# Patient Record
Sex: Male | Born: 1935 | Race: Black or African American | Hispanic: No | Smoking: Former smoker
Health system: Southern US, Community
[De-identification: ages and names within clinical notes are randomized; demographics above are authoritative.]

## PROBLEM LIST (undated history)

## (undated) DIAGNOSIS — I10 Essential (primary) hypertension: Secondary | ICD-10-CM

## (undated) HISTORY — PX: TOTAL KNEE ARTHROPLASTY: SHX125

---

## 2003-09-22 ENCOUNTER — Other Ambulatory Visit: Payer: Self-pay

## 2004-04-23 ENCOUNTER — Emergency Department: Payer: Self-pay | Admitting: Emergency Medicine

## 2004-07-14 ENCOUNTER — Emergency Department: Payer: Self-pay | Admitting: Emergency Medicine

## 2004-07-16 ENCOUNTER — Inpatient Hospital Stay: Payer: Self-pay

## 2004-08-01 ENCOUNTER — Ambulatory Visit: Payer: Self-pay | Admitting: Urology

## 2005-03-12 ENCOUNTER — Other Ambulatory Visit: Payer: Self-pay

## 2005-03-13 ENCOUNTER — Inpatient Hospital Stay: Payer: Self-pay

## 2005-07-03 ENCOUNTER — Emergency Department: Payer: Self-pay | Admitting: Emergency Medicine

## 2005-08-27 ENCOUNTER — Emergency Department: Payer: Self-pay | Admitting: Internal Medicine

## 2005-09-30 ENCOUNTER — Ambulatory Visit: Payer: Self-pay | Admitting: Infectious Diseases

## 2006-02-04 ENCOUNTER — Emergency Department: Payer: Self-pay | Admitting: Emergency Medicine

## 2006-10-17 ENCOUNTER — Emergency Department: Payer: Self-pay | Admitting: Emergency Medicine

## 2007-07-30 ENCOUNTER — Emergency Department: Payer: Self-pay | Admitting: Emergency Medicine

## 2008-02-10 ENCOUNTER — Emergency Department: Payer: Self-pay | Admitting: Unknown Physician Specialty

## 2008-04-16 ENCOUNTER — Other Ambulatory Visit: Payer: Self-pay

## 2008-04-16 ENCOUNTER — Emergency Department: Payer: Self-pay | Admitting: Unknown Physician Specialty

## 2008-07-18 ENCOUNTER — Emergency Department: Payer: Self-pay | Admitting: Emergency Medicine

## 2008-08-18 ENCOUNTER — Emergency Department: Payer: Self-pay | Admitting: Emergency Medicine

## 2008-09-17 ENCOUNTER — Emergency Department: Payer: Self-pay

## 2008-09-28 ENCOUNTER — Emergency Department: Payer: Self-pay | Admitting: Emergency Medicine

## 2008-10-24 ENCOUNTER — Emergency Department: Payer: Self-pay | Admitting: Emergency Medicine

## 2008-11-23 ENCOUNTER — Emergency Department: Payer: Self-pay | Admitting: Unknown Physician Specialty

## 2009-03-14 ENCOUNTER — Emergency Department: Payer: Self-pay | Admitting: Unknown Physician Specialty

## 2009-03-20 ENCOUNTER — Emergency Department: Payer: Self-pay | Admitting: Internal Medicine

## 2009-03-31 ENCOUNTER — Observation Stay: Payer: Self-pay | Admitting: Internal Medicine

## 2009-05-10 ENCOUNTER — Emergency Department: Payer: Self-pay | Admitting: Emergency Medicine

## 2009-09-26 ENCOUNTER — Emergency Department: Payer: Self-pay | Admitting: Emergency Medicine

## 2009-10-27 ENCOUNTER — Emergency Department: Payer: Self-pay | Admitting: Emergency Medicine

## 2009-12-21 ENCOUNTER — Emergency Department: Payer: Self-pay | Admitting: Emergency Medicine

## 2010-04-18 ENCOUNTER — Emergency Department: Payer: Self-pay | Admitting: Emergency Medicine

## 2010-09-23 IMAGING — CR DG TIBIA/FIBULA 2V*L*
1 series · 2 of 2 positions shown · non-contrast
Comparison: none

REASON FOR EXAM: Pain, Left lower leg, ankle - non-traumatic - very
remote history of Rod in Tib
COMMENTS:

[Series 1: view not recorded · 0.17mm/px · 2 of 2 slices shown]
[im 1/2]
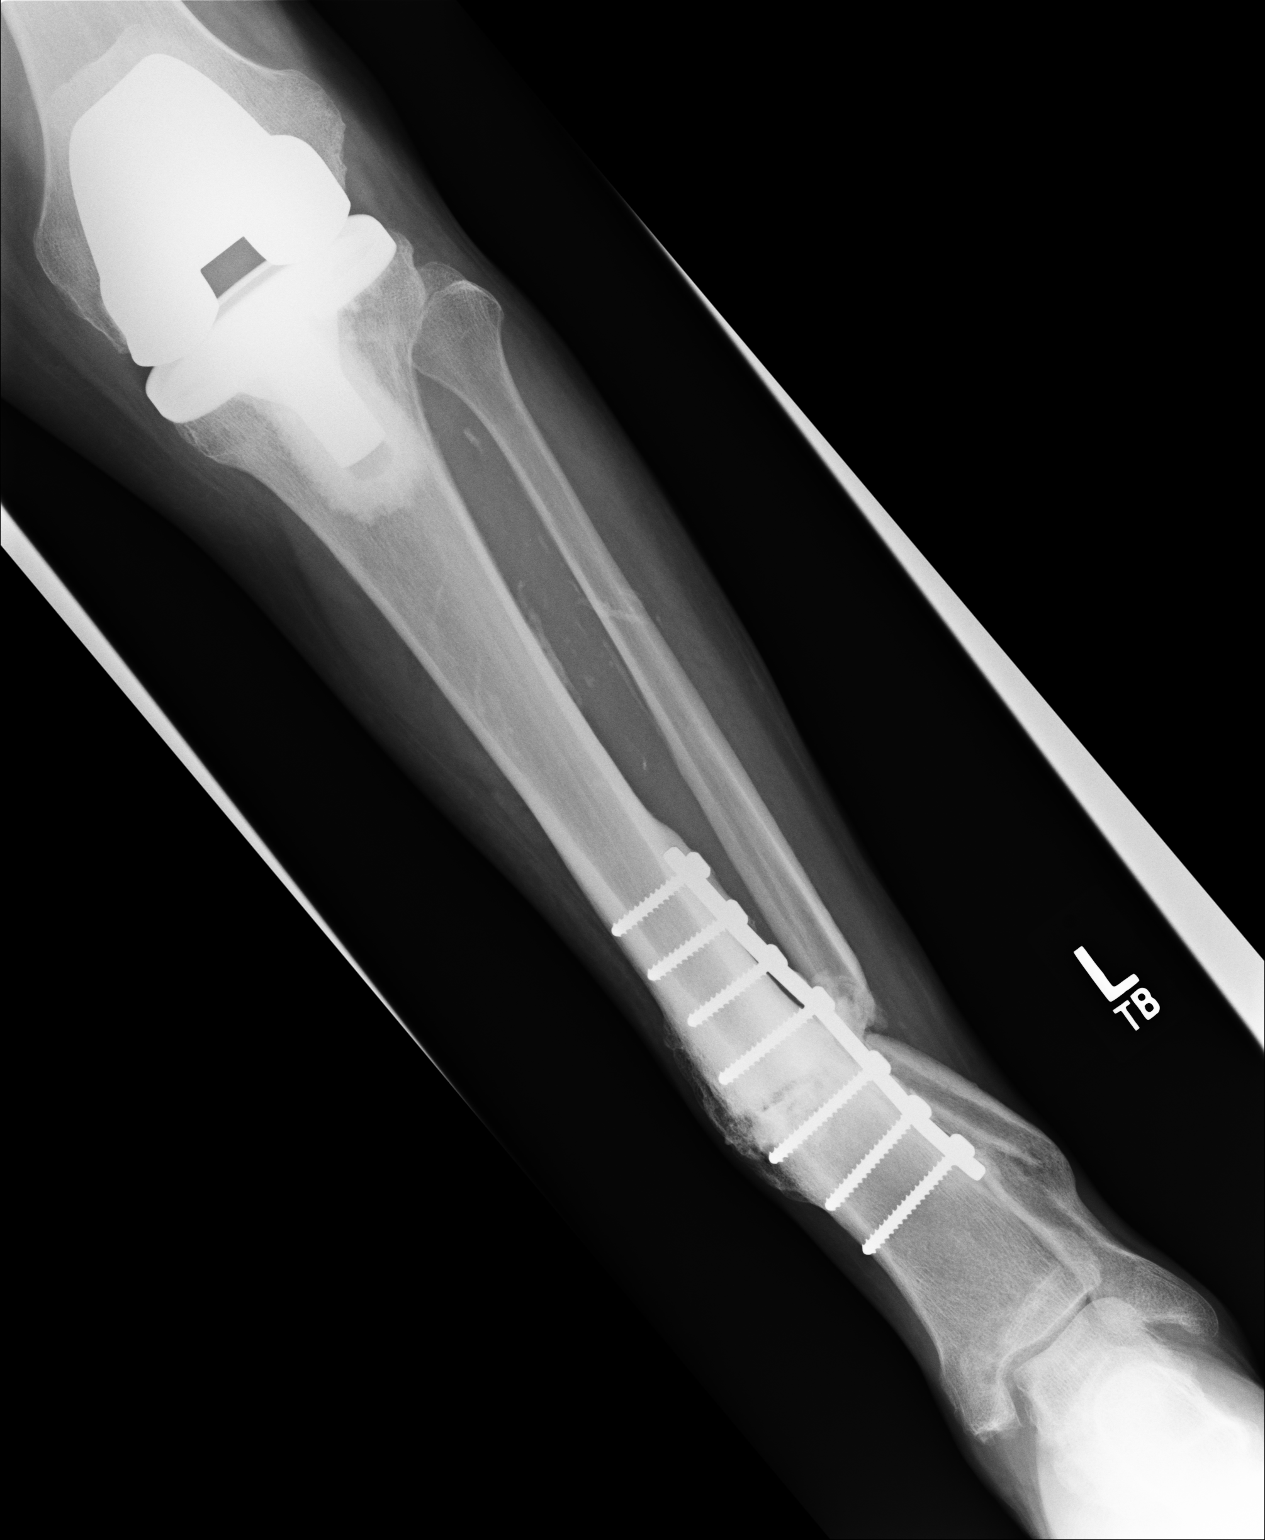
[im 2/2]
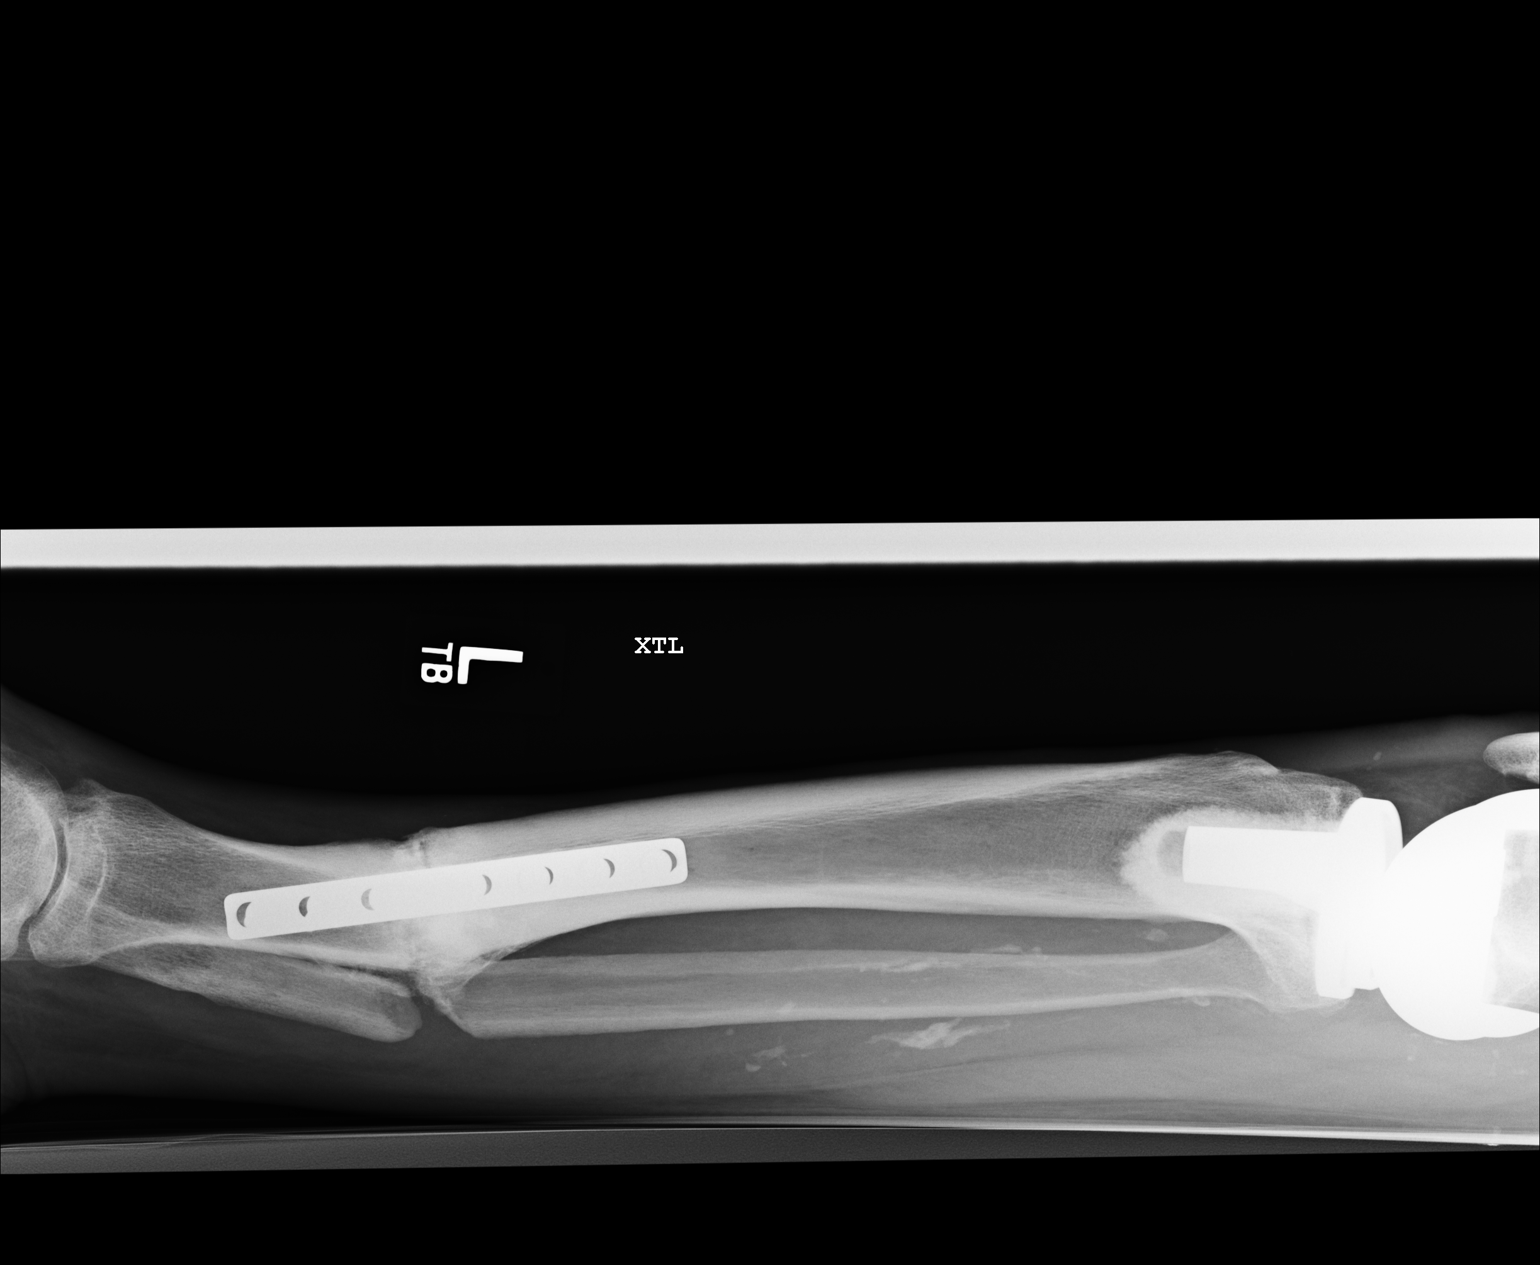

[2 of 2 positions shown; findings below may reference images not displayed]

PROCEDURE:     DXR - DXR TIBIA AND FIBULA LT (LOWER L  - December 21, 2009  [DATE]

RESULT:     AP and lateral views of the left tibia and fibula are submitted.
The patient has undergone prior total knee joint replacement. The patient
also undergone prior ORIF for a fracture involving the shaft of the distal
the tibia. A fracture line remains visible. A orthopedic sideplate and
compression screw are present. There is an old ununited fracture of the
junction of the middle and distal thirds of the left tibia.
IMPRESSION: 1. The patient has undergone prior ORIF for distal tibial shaft fracture.
The fracture line remains visible. There is a small amount of periosteal
reaction. The orthopedic side plate and compression screws appear intact.
2. There is an old nonunion of an adjacent fibular shaft fracture. There is
angulation at this site. There is old deformity more distally involving the
shaft of the fibula that also appears old.
3. The patient has undergone prior left total knee joint replacement.

## 2010-11-17 ENCOUNTER — Emergency Department: Payer: Self-pay | Admitting: Emergency Medicine

## 2011-02-28 ENCOUNTER — Emergency Department: Payer: Self-pay | Admitting: Emergency Medicine

## 2011-06-02 ENCOUNTER — Emergency Department: Payer: Self-pay | Admitting: Emergency Medicine

## 2011-09-17 ENCOUNTER — Ambulatory Visit: Payer: Self-pay | Admitting: Ophthalmology

## 2011-10-01 ENCOUNTER — Emergency Department: Payer: Self-pay | Admitting: Emergency Medicine

## 2012-03-07 ENCOUNTER — Observation Stay: Payer: Self-pay | Admitting: Internal Medicine

## 2012-03-07 LAB — COMPREHENSIVE METABOLIC PANEL
Albumin: 3.3 g/dL — ABNORMAL LOW (ref 3.4–5.0)
Alkaline Phosphatase: 74 U/L (ref 50–136)
BUN: 12 mg/dL (ref 7–18)
Chloride: 109 mmol/L — ABNORMAL HIGH (ref 98–107)
Co2: 25 mmol/L (ref 21–32)
Creatinine: 1.2 mg/dL (ref 0.60–1.30)
EGFR (Non-African Amer.): 54 — ABNORMAL LOW
Osmolality: 279 (ref 275–301)
Potassium: 4 mmol/L (ref 3.5–5.1)
SGOT(AST): 9 U/L — ABNORMAL LOW (ref 15–37)
Sodium: 140 mmol/L (ref 136–145)

## 2012-03-07 LAB — PRO B NATRIURETIC PEPTIDE: B-Type Natriuretic Peptide: 391 pg/mL (ref 0–450)

## 2012-03-07 LAB — CBC
MCH: 29.3 pg (ref 26.0–34.0)
MCHC: 33.6 g/dL (ref 32.0–36.0)
MCV: 87 fL (ref 80–100)
Platelet: 172 10*3/uL (ref 150–440)
RBC: 4.46 10*6/uL (ref 4.40–5.90)
RDW: 15.8 % — ABNORMAL HIGH (ref 11.5–14.5)

## 2012-03-07 LAB — TROPONIN I: Troponin-I: <0.02 ng/mL

## 2012-03-07 LAB — CK TOTAL AND CKMB (NOT AT ARMC): CK-MB: 0.9 ng/mL (ref 0.5–3.6)

## 2012-03-08 LAB — LIPID PANEL
Cholesterol: 177 mg/dL (ref 0–200)
HDL Cholesterol: 71 mg/dL — ABNORMAL HIGH (ref 40–60)
Ldl Cholesterol, Calc: 90 mg/dL (ref 0–100)
VLDL Cholesterol, Calc: 16 mg/dL (ref 5–40)

## 2012-03-08 LAB — TROPONIN I: Troponin-I: <0.02 ng/mL

## 2012-03-08 LAB — TSH: Thyroid Stimulating Horm: 1.55 u[IU]/mL

## 2012-03-23 ENCOUNTER — Ambulatory Visit: Payer: Self-pay | Admitting: Urology

## 2012-03-25 ENCOUNTER — Ambulatory Visit: Payer: Self-pay | Admitting: Internal Medicine

## 2012-06-04 ENCOUNTER — Emergency Department: Payer: Self-pay | Admitting: Emergency Medicine

## 2012-06-05 LAB — TROPONIN I
Troponin-I: <0.02 ng/mL
Troponin-I: <0.02 ng/mL

## 2012-06-05 LAB — COMPREHENSIVE METABOLIC PANEL
Albumin: 3.3 g/dL — ABNORMAL LOW (ref 3.4–5.0)
Alkaline Phosphatase: 89 U/L (ref 50–136)
Bilirubin,Total: 0.3 mg/dL (ref 0.2–1.0)
Calcium, Total: 8.4 mg/dL — ABNORMAL LOW (ref 8.5–10.1)
Chloride: 107 mmol/L (ref 98–107)
Co2: 25 mmol/L (ref 21–32)
EGFR (Non-African Amer.): 58 — ABNORMAL LOW
Glucose: 107 mg/dL — ABNORMAL HIGH (ref 65–99)
Osmolality: 277 (ref 275–301)
Potassium: 4.5 mmol/L (ref 3.5–5.1)
SGPT (ALT): 16 U/L (ref 12–78)
Sodium: 138 mmol/L (ref 136–145)
Total Protein: 7.2 g/dL (ref 6.4–8.2)

## 2012-06-05 LAB — CBC
MCH: 28.9 pg (ref 26.0–34.0)
MCHC: 32.7 g/dL (ref 32.0–36.0)
MCV: 88 fL (ref 80–100)
Platelet: 180 10*3/uL (ref 150–440)
WBC: 4.3 10*3/uL (ref 3.8–10.6)

## 2012-06-05 LAB — CK TOTAL AND CKMB (NOT AT ARMC): CK, Total: 123 U/L (ref 35–232)

## 2013-07-05 ENCOUNTER — Emergency Department: Payer: Self-pay | Admitting: Emergency Medicine

## 2013-11-20 ENCOUNTER — Emergency Department: Payer: Self-pay | Admitting: Emergency Medicine

## 2014-01-18 ENCOUNTER — Ambulatory Visit: Payer: Self-pay | Admitting: Ophthalmology

## 2014-04-07 IMAGING — CR DG CHEST 2V
1 series · 2 of 2 positions shown · non-contrast
Comparison: 06/05/2012 and 03/07/2012

CLINICAL DATA: Cough and congestion.

EXAM:
CHEST  2 VIEW

[Series 1: w chest pa · 0.14mm/px · 2 of 2 slices shown]
[im 1/2]
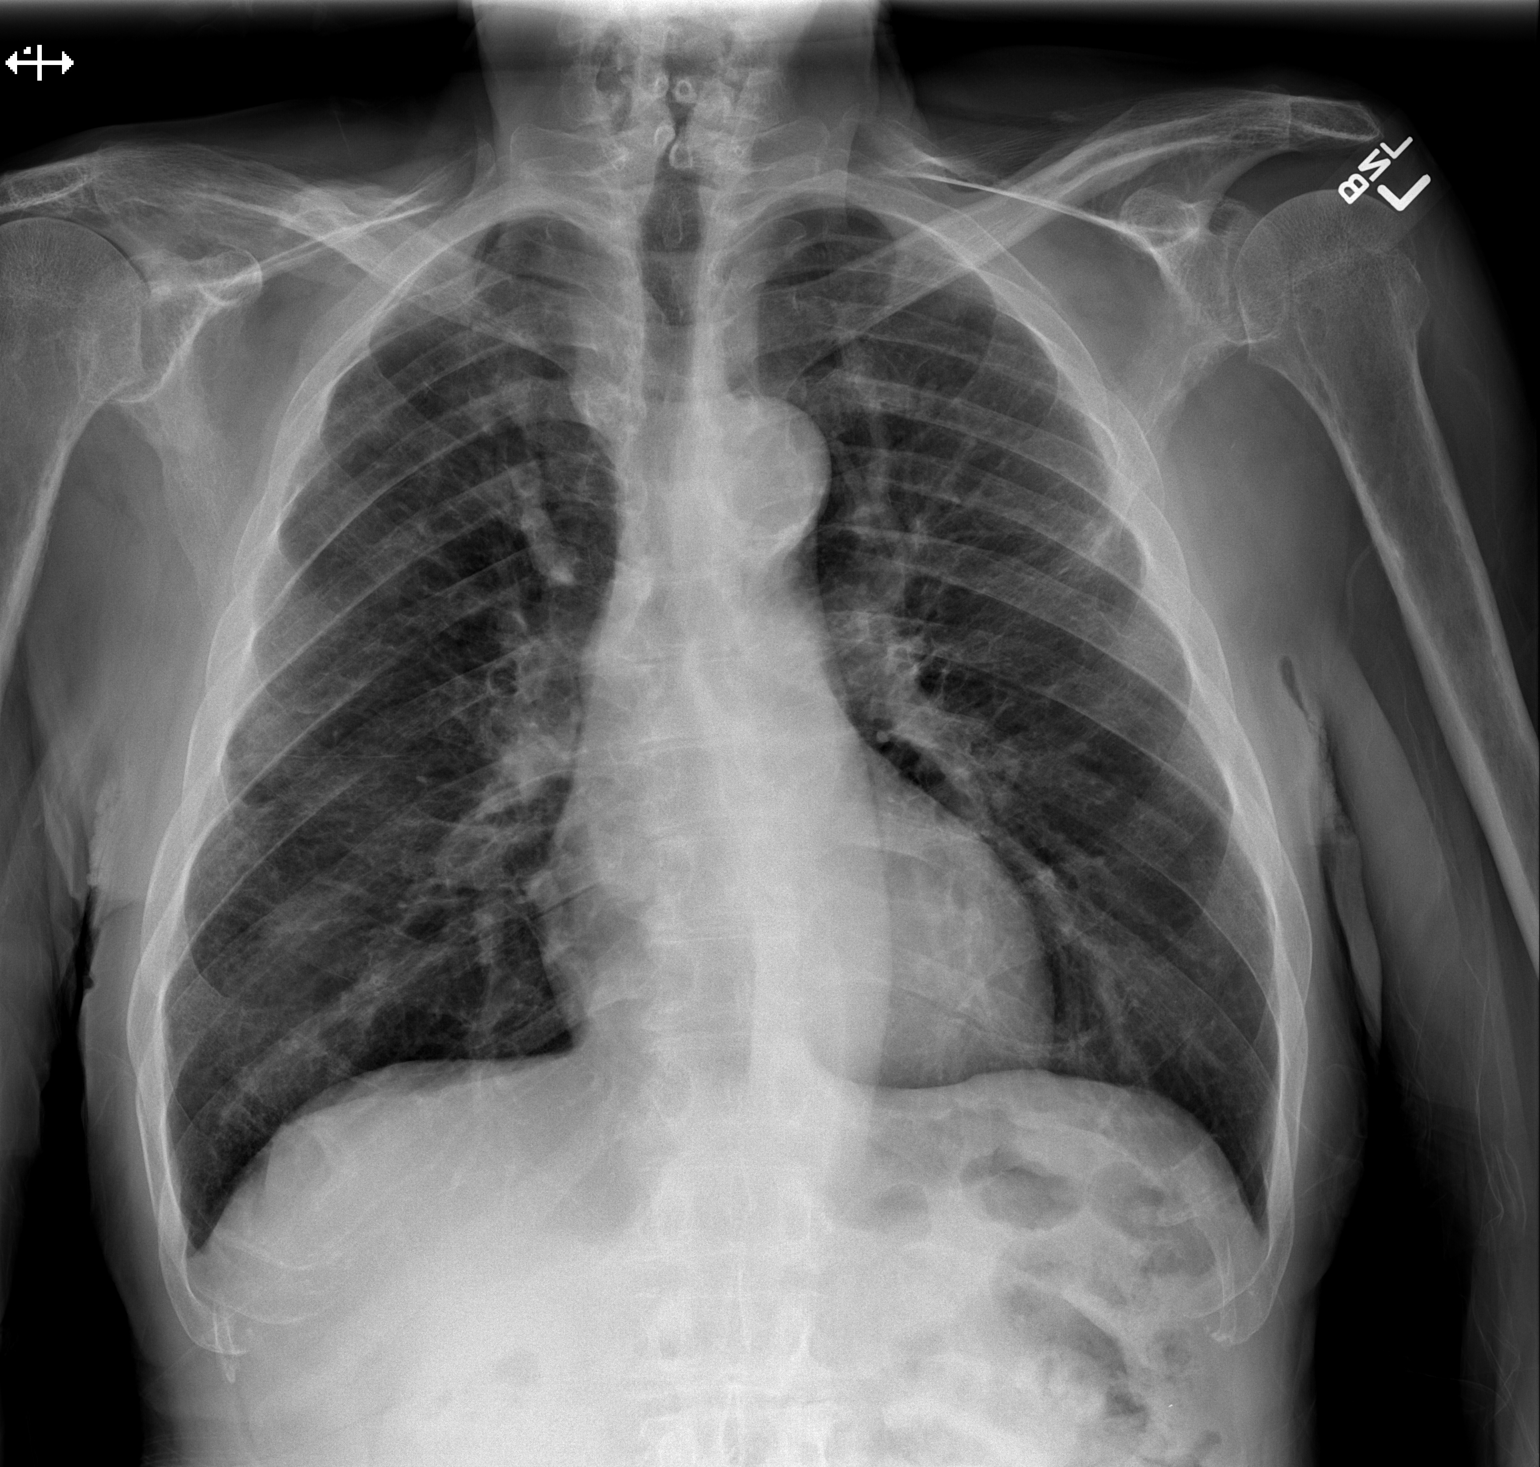
[im 2/2]
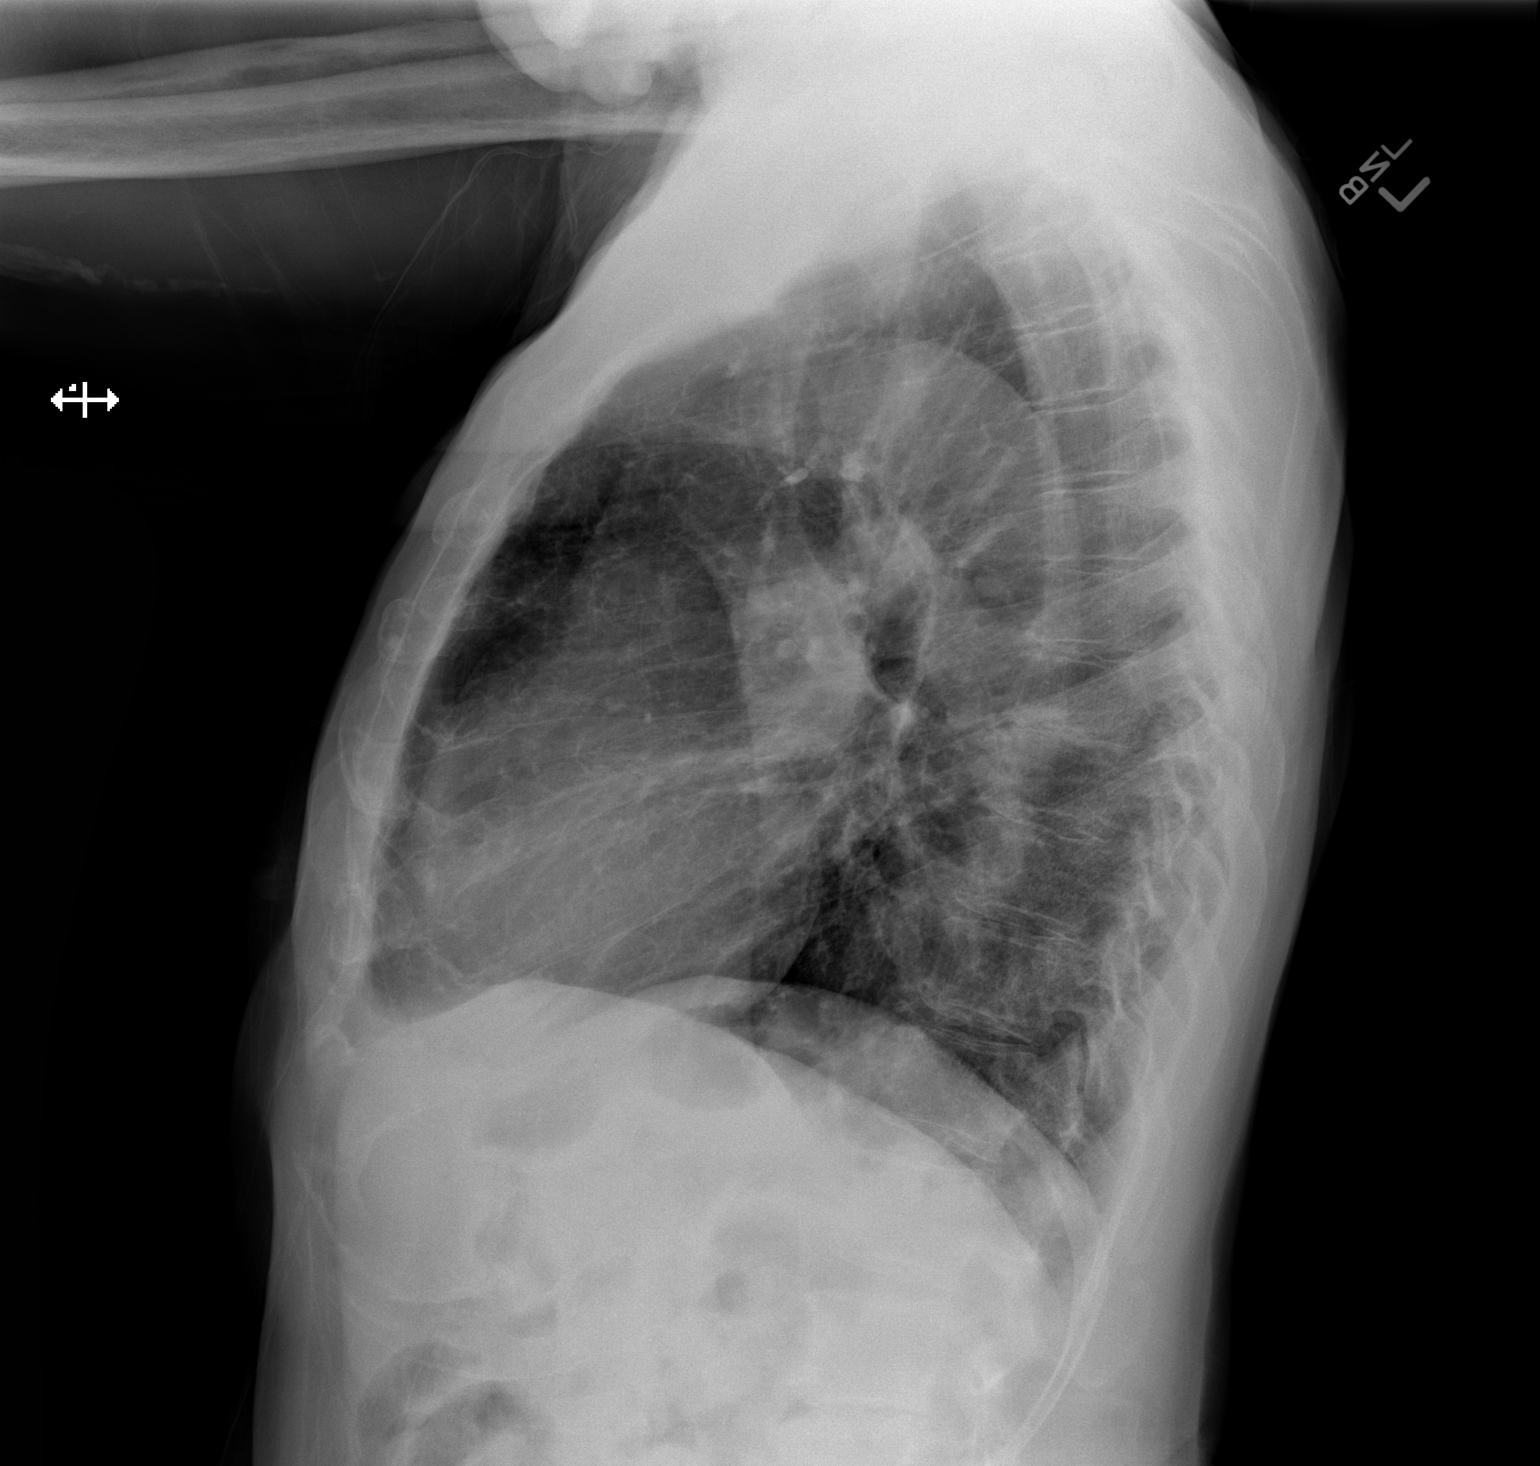

[2 of 2 positions shown; findings below may reference images not displayed]

FINDINGS: The heart size and mediastinal contours are stable. There is mild
aortic atherosclerosis. Chronic lung disease with generalized
interstitial prominence appears unchanged. No airspace disease,
pleural effusion or pneumothorax is identified. Old fracture of the
mid right clavicle appears stable.
IMPRESSION: Stable chronic lung disease. No acute cardiopulmonary process
identified.

## 2014-05-28 ENCOUNTER — Emergency Department: Payer: Self-pay | Admitting: Internal Medicine

## 2014-05-28 LAB — COMPREHENSIVE METABOLIC PANEL
ALBUMIN: 2.9 g/dL — AB (ref 3.4–5.0)
ALK PHOS: 68 U/L
ALT: 17 U/L
ANION GAP: 5 — AB (ref 7–16)
BILIRUBIN TOTAL: 0.2 mg/dL (ref 0.2–1.0)
BUN: 13 mg/dL (ref 7–18)
CHLORIDE: 111 mmol/L — AB (ref 98–107)
CO2: 28 mmol/L (ref 21–32)
Calcium, Total: 7.9 mg/dL — ABNORMAL LOW (ref 8.5–10.1)
Creatinine: 1.13 mg/dL (ref 0.60–1.30)
EGFR (African American): >60
EGFR (Non-African Amer.): >60
Glucose: 85 mg/dL (ref 65–99)
Osmolality: 286 (ref 275–301)
POTASSIUM: 4.6 mmol/L (ref 3.5–5.1)
SGOT(AST): 12 U/L — ABNORMAL LOW (ref 15–37)
Sodium: 144 mmol/L (ref 136–145)
Total Protein: 6.4 g/dL (ref 6.4–8.2)

## 2014-05-28 LAB — ED INFLUENZA
H1N1 flu by pcr: NOT DETECTED
INFLAPCR: NEGATIVE
INFLBPCR: NEGATIVE

## 2014-05-28 LAB — CBC
HCT: 36.5 % — AB (ref 40.0–52.0)
HGB: 11.7 g/dL — AB (ref 13.0–18.0)
MCH: 28.6 pg (ref 26.0–34.0)
MCHC: 32 g/dL (ref 32.0–36.0)
MCV: 90 fL (ref 80–100)
PLATELETS: 158 10*3/uL (ref 150–440)
RBC: 4.08 10*6/uL — ABNORMAL LOW (ref 4.40–5.90)
RDW: 14.7 % — ABNORMAL HIGH (ref 11.5–14.5)
WBC: 4.5 10*3/uL (ref 3.8–10.6)

## 2014-07-23 ENCOUNTER — Emergency Department: Payer: Self-pay | Admitting: Emergency Medicine

## 2014-07-23 LAB — TROPONIN I

## 2014-07-23 LAB — COMPREHENSIVE METABOLIC PANEL
ALK PHOS: 88 U/L
ALT: 18 U/L
Albumin: 3.2 g/dL — ABNORMAL LOW (ref 3.4–5.0)
Anion Gap: 6 — ABNORMAL LOW (ref 7–16)
BUN: 14 mg/dL (ref 7–18)
Bilirubin,Total: 0.3 mg/dL (ref 0.2–1.0)
CHLORIDE: 106 mmol/L (ref 98–107)
Calcium, Total: 8.1 mg/dL — ABNORMAL LOW (ref 8.5–10.1)
Co2: 28 mmol/L (ref 21–32)
Creatinine: 1.23 mg/dL (ref 0.60–1.30)
EGFR (African American): >60
EGFR (Non-African Amer.): 59 — ABNORMAL LOW
GLUCOSE: 103 mg/dL — AB (ref 65–99)
Osmolality: 280 (ref 275–301)
Potassium: 4.4 mmol/L (ref 3.5–5.1)
SGOT(AST): 23 U/L (ref 15–37)
Sodium: 140 mmol/L (ref 136–145)
TOTAL PROTEIN: 6.9 g/dL (ref 6.4–8.2)

## 2014-07-23 LAB — CBC
HCT: 37.2 % — ABNORMAL LOW (ref 40.0–52.0)
HGB: 12.1 g/dL — ABNORMAL LOW (ref 13.0–18.0)
MCH: 28.8 pg (ref 26.0–34.0)
MCHC: 32.4 g/dL (ref 32.0–36.0)
MCV: 89 fL (ref 80–100)
PLATELETS: 159 10*3/uL (ref 150–440)
RBC: 4.19 10*6/uL — ABNORMAL LOW (ref 4.40–5.90)
RDW: 14.9 % — AB (ref 11.5–14.5)
WBC: 4 10*3/uL (ref 3.8–10.6)

## 2014-07-23 LAB — LIPASE, BLOOD: Lipase: 51 U/L — ABNORMAL LOW (ref 73–393)

## 2014-07-24 LAB — TROPONIN I

## 2014-07-24 LAB — MAGNESIUM: Magnesium: 2.3 mg/dL

## 2014-10-24 NOTE — Consult Note (Signed)
    General Aspect patient is an 81 year old male with history of chronic left bundle branch block and bradycardia who is admitted with several days of left arm pain.  Patient denied any dizziness, lightheadedness weakness or fatigue.  His left arm had been bothering him and he was unaware of what was causing it.  He was admitted and has ruled out for myocardial infarction.  He was relatively bradycardic but hemodynamically stable.  His beta blocker was held.  His left arm pain has improved.  X-ray of his arm has not shown any significant abnormalities. the patient states that he only gets dizzy when he is laying in bed when he first lays down for the first few minutes.   Physical Exam:   GEN thin    HEENT hearing intact to voice    NECK supple    RESP normal resp effort    CARD Regular rate and rhythm  Bradycardic    ABD denies tenderness  normal BS    LYMPH negative neck    EXTR negative edema    SKIN No rashes    NEURO cranial nerves intact, motor/sensory function intact    PSYCH A+O to time, place, person   Review of Systems:   Subjective/Chief Complaint left arm pain    General: No Complaints    Skin: No Complaints    ENT: No Complaints    Eyes: No Complaints    Neck: No Complaints    Respiratory: No Complaints    Cardiovascular: No Complaints    Gastrointestinal: No Complaints    Genitourinary: No Complaints    Vascular: No Complaints    Musculoskeletal: left arm and shoulder pain    Neurologic: dizziness when laying down    Hematologic: No Complaints    Endocrine: No Complaints    Psychiatric: No Complaints    Review of Systems: All other systems were reviewed and found to be negative    Medications/Allergies Reviewed Medications/Allergies reviewed     GERD - Esophageal Reflux:    Hard of Hearing:    Pneumonia:    Negative, Patient denies surgical history:   EKG:   Abnormal LBBB    Interpretation bradycardia    No Known Allergies:      Impression 81 year old male with history of chronic left bundle branch block who is admitted with left arm pain.  He denies any dizziness or lightheadedness or syncope.  The only dizziness that he notes one occasion as when he is laying in bed and rolls over or first lays down, the symptoms resolved within minutes.  He denies chest pain.  He is ruled out for myocardial infarction.  EKG is unchanged from his previous EKGs.   He has had his beta blocker held and has done well with this.  Patient is at his baseline.  He does not appear to require a permanent pacemaker at present.  Would continue to hold his beta blockers, ambulating consider discharge if he is stable hemodynamically.    Plan 1.  Agree with holding metoprolol 2.  Ambulate and follow for evidence of symptoms 3.  Consider discharge is stable with followup with his primary care provider as well as with Dr. Juliann Paresallwood, his cardiologist.   Electronic Signatures: Dalia HeadingFath, Loriene Taunton A (MD)  (Signed 02-Sep-13 10:58)  Authored: General Aspect/Present Illness, History and Physical Exam, Review of System, Past Medical History, EKG , Allergies, Impression/Plan   Last Updated: 02-Sep-13 10:58 by Dalia HeadingFath, Earlyne Feeser A (MD)
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
## 2014-10-24 NOTE — Discharge Summary (Signed)
PATIENT NAME:  Richard [name], Richard [name] MR#:  [id number] DATE OF BIRTH:  11/13/1920  DATE OF ADMISSION:  03/07/2012 DATE OF DISCHARGE:  03/08/2012  PRESENTING COMPLAINT: Left arm pain.   DISCHARGE DIAGNOSES:  1. Left arm pain improved, appears more musculoskeletal.  2. Dizziness, improved.  3. History of left bundle branch block per EKG which is chronic.  DISCHARGE MEDICATIONS: 1. Aspirin 81 mg daily.  2. Multivitamin daily.   DIET: Regular.   FOLLOWUP: Follow up with Dr. Dorothyann Pengwayne Callwood,  cardiologist, as outpatient in 1 to 2 weeks.   LABORATORY, DIAGNOSTIC, AND RADIOLOGICAL DATA: Lipid profile within normal limits. TSH 1.55, cardiac enzymes times three negative. Left shoulder x-ray negative for any acute abnormality. CBC within normal limits. Comprehensive metabolic panel within normal limits. B-type natriuretic peptide 391.   EKG: Normal sinus rhythm with sinus arrhythmia.    CONSULTATION: Cardiology consultation with Dr. Lady GaryFath.   BRIEF SUMMARY OF HOSPITAL COURSE: [name] is an 81 year old African American gentleman who came into the Emergency Room with:  1. Left arm pain. The patient had been having some arm pain for the last couple of days. It improved after giving him IV pain medications. He was admitted. Cardiac enzymes remained negative. No acute EKG changes were noted. The patient's arm pain appeared to be arthritic/musculoskeletal. His EKG showed bradycardia with left bundle branch block which was the same as compared to 2010. No documented history of coronary artery disease. Echo in 2010 showed an ejection fraction around 45%. The patient was seen by Dr. Lady GaryFath. Since the patient remained asymptomatic and ambulated well, no chest pain, and arm pain improved, he will follow up with Dr. Juliann Paresallwood as outpatient. Aspirin was continued.  2. Gastroesophageal reflux disease. On Prilosec.  3. Impaired hearing.  4. Hospital stay otherwise remained stable.  5. CODE STATUS: The patient remained [name]  CODE.   TIME SPENT: 40 minutes.   ____________________________ Wylie HailSona [name]. Allena KatzPatel, MD sap:bjt D: 03/09/2012 15:16:42 ET T: 03/10/2012 13:39:07 ET JOB#: [id number]  cc: Jodye Scali [name]. Allena KatzPatel, MD, <Dictator> Dwayne D. Juliann Paresallwood, MD Willow OraSONA [name] Niharika Savino MD ELECTRONICALLY SIGNED 03/22/2012 22:13

## 2014-12-08 ENCOUNTER — Emergency Department
Admission: EM | Admit: 2014-12-08 | Discharge: 2014-12-08 | Disposition: A | Discharge status: Home / Self Care | Payer: Medicare Other | Attending: Emergency Medicine | Admitting: Emergency Medicine

## 2014-12-08 ENCOUNTER — Encounter: Payer: Self-pay | Admitting: *Deleted

## 2014-12-08 DIAGNOSIS — Y9289 Other specified places as the place of occurrence of the external cause: Secondary | ICD-10-CM | POA: Insufficient documentation

## 2014-12-08 DIAGNOSIS — Z792 Long term (current) use of antibiotics: Secondary | ICD-10-CM | POA: Insufficient documentation

## 2014-12-08 DIAGNOSIS — I10 Essential (primary) hypertension: Secondary | ICD-10-CM | POA: Diagnosis not present

## 2014-12-08 DIAGNOSIS — S30861A Insect bite (nonvenomous) of abdominal wall, initial encounter: Secondary | ICD-10-CM | POA: Diagnosis not present

## 2014-12-08 DIAGNOSIS — R21 Rash and other nonspecific skin eruption: Secondary | ICD-10-CM | POA: Diagnosis present

## 2014-12-08 DIAGNOSIS — Y998 Other external cause status: Secondary | ICD-10-CM | POA: Insufficient documentation

## 2014-12-08 DIAGNOSIS — W57XXXA Bitten or stung by nonvenomous insect and other nonvenomous arthropods, initial encounter: Secondary | ICD-10-CM | POA: Diagnosis not present

## 2014-12-08 DIAGNOSIS — S40261A Insect bite (nonvenomous) of right shoulder, initial encounter: Secondary | ICD-10-CM | POA: Insufficient documentation

## 2014-12-08 DIAGNOSIS — Y9389 Activity, other specified: Secondary | ICD-10-CM | POA: Insufficient documentation

## 2014-12-08 DIAGNOSIS — Z87891 Personal history of nicotine dependence: Secondary | ICD-10-CM | POA: Diagnosis not present

## 2014-12-08 HISTORY — DX: Essential (primary) hypertension: I10

## 2014-12-08 MED ORDER — DOXYCYCLINE HYCLATE 100 MG PO TABS: 100.0000 mg | ORAL_TABLET | Freq: Two times a day (BID) | ORAL | Status: DC

## 2014-12-08 NOTE — ED Provider Notes (Signed)
[facility] Emergency Department Provider Note ?____________________________________________ ? Time seen: 1125 ? I have reviewed the triage vital signs and the nursing notes. ________ HISTORY ? Chief Complaint Rash  HPI  Richard [name] is a 81 y.o. male , who reports to the ED for evaluation of several tick bites,and removal of one tick.he reports a tick likely to his right shoulder, and reports that he removed to other ticks after working in the garden this weekend. He denies fever, chills, sweats, joint pain, or weakness. He notes that the 2 tick bites areas are itchy.  Past Medical History  Diagnosis Date  . Hypertension    There are no active problems to display for this patient. ? History reviewed. No pertinent past surgical history. ? Current Outpatient Rx  Name  Route  Sig  Dispense  Refill  . doxycycline (VIBRA-TABS) 100 MG tablet   Oral   Take 1 tablet (100 mg total) by mouth 2 (two) times daily.   14 tablet   0   ? Allergies Review of patient's allergies indicates no known allergies. ? No family history on file. ? Social History History  Substance Use Topics  . Smoking status: Former Games developer  . Smokeless tobacco: Not on file  . Alcohol Use: Yes   Review of Systems Constitutional: Negative for fever. HEENT:  Normocephalic/atraumatic. Negative for visual/hearingchanges, sore throat, or nasal congestion. Cardiovascular: Negative for chest pain. Respiratory: Negative for shortness of breath. Musculoskeletal: Negative for back pain. Skin: 2 distinct tick bites. Tick confirmed, removed by RN in triage. Neurological: Negative for headaches, focal weakness or numbness. Hematological/Lymphatic:Negative for enlarged lymph nodes  10-point ROS otherwise negative. ____________________________________________  PHYSICAL EXAM:  VITAL SIGNS: ED Triage Vitals  Enc Vitals Group     BP 12/08/14 1108 139/74 mmHg     Pulse Rate 12/08/14 1108 62    Resp 12/08/14 1108 24     Temp 12/08/14 1108 97.6 F (36.4 C)     Temp Source 12/08/14 1108 Oral     SpO2 12/08/14 1108 100 %     Weight 12/08/14 1108 135 lb (61.236 kg)     Height 12/08/14 1108  (1.676 m)     Head Cir --      Peak Flow --      Pain Score --      Pain Loc --      Pain Edu? --      Excl. in GC? --    Constitutional: Alert and oriented. Well appearing and in no distress. HEENT: Normocephalic and atraumatic.Conjunctivae are normal. PERRL. Normal extraocular movements. Mucous membranes are moist. Hematological/Lymphatic/Immunilogical: No cervical lymphadenopathy. Cardiovascular: Normal rate, regular rhythm.No murmurs, rubs, or gallops.  Respiratory: Normal respiratory effort. Musculoskeletal: Nontender with normal range of motion in all extremities. No joint effusions.  No lower extremity tenderness nor edema. Neurologic:  Normal speech and language. No gross focal neurologic deficits are appreciated.  Skin:  Distinct papule with local swelling and erythema and central bite site to the right shoulder blade and right flank. Skin general survey did not reveal any other retained ticks.  Psychiatric: Mood and affect are normal. Speech and behavior are normal. Patient exhibits appropriate insight and judgment. ______________________________________________________ INITIAL IMPRESSION / ASSESSMENT AND PLAN / ED COURSE ? Empiric treatment for multiple tick bites with local cellulitis, with Doxycycline for 1 week.   ____________________________________________ FINAL CLINICAL IMPRESSION(S) / ED DIAGNOSES?  Final diagnoses:  Tick bite      Charlesetta Ivory  Lanise Mergen, PA-C 12/08/14 1416  Governor Rooksebecca Lord, MD 12/08/14 1416

## 2014-12-08 NOTE — ED Notes (Signed)
Pt c/o itching red spot on right hip & right arm (denies itching on this one - just red and raised) as well as lower back; states he has had these spots for 2-3 days; denies pain. Also a small mole or tick on right upper upper shoulder blade.

## 2014-12-08 NOTE — ED Notes (Signed)
Pt discharged home after verbalizing understanding of discharge instructions; nad noted. 

## 2014-12-08 NOTE — Discharge Instructions (Signed)
Tick Bite Information Ticks are insects that attach themselves to the skin and draw blood for food. There are various types of ticks. Common types include wood ticks and deer ticks. Most ticks live in shrubs and grassy areas. Ticks can climb onto your body when you make contact with leaves or grass where the tick is waiting. The most common places on the body for ticks to attach themselves are the scalp, neck, armpits, waist, and groin. Most tick bites are harmless, but sometimes ticks carry germs that cause diseases. These germs can be spread to a person during the tick's feeding process. The chance of a disease spreading through a tick bite depends on:   The type of tick.  Time of year.   How long the tick is attached.   Geographic location.  HOW CAN YOU PREVENT TICK BITES? Take these steps to help prevent tick bites when you are outdoors:  Wear protective clothing. Long sleeves and long pants are best.   Wear white clothes so you can see ticks more easily.  Tuck your pant legs into your socks.   If walking on a trail, stay in the middle of the trail to avoid brushing against bushes.  Avoid walking through areas with long grass.  Put insect repellent on all exposed skin and along boot tops, pant legs, and sleeve cuffs.   Check clothing, hair, and skin repeatedly and before going inside.   Brush off any ticks that are not attached.  Take a shower or bath as soon as possible after being outdoors.  WHAT IS THE PROPER WAY TO REMOVE A TICK? Ticks should be removed as soon as possible to help prevent diseases caused by tick bites. 1. If latex gloves are available, put them on before trying to remove a tick.  2. Using fine-point tweezers, grasp the tick as close to the skin as possible. You may also use curved forceps or a tick removal tool. Grasp the tick as close to its head as possible. Avoid grasping the tick on its body. 3. Pull gently with steady upward pressure until  the tick lets go. Do not twist the tick or jerk it suddenly. This may break off the tick's head or mouth parts. 4. Do not squeeze or crush the tick's body. This could force disease-carrying fluids from the tick into your body.  5. After the tick is removed, wash the bite area and your hands with soap and water or other disinfectant such as alcohol. 6. Apply a small amount of antiseptic cream or ointment to the bite site.  7. Wash and disinfect any instruments that were used.  Do not try to remove a tick by applying a hot match, petroleum jelly, or fingernail polish to the tick. These methods do not work and may increase the chances of disease being spread from the tick bite.  WHEN SHOULD YOU SEEK MEDICAL CARE? Contact your health care provider if you are unable to remove a tick from your skin or if a part of the tick breaks off and is stuck in the skin.  After a tick bite, you need to be aware of signs and symptoms that could be related to diseases spread by ticks. Contact your health care provider if you develop any of the following in the days or weeks after the tick bite:  Unexplained fever.  Rash. A circular rash that appears days or weeks after the tick bite may indicate the possibility of Lyme disease. The rash may resemble  a target with a bull's-eye and may occur at a different part of your body than the tick bite.  Redness and swelling in the area of the tick bite.   Tender, swollen lymph glands.   Diarrhea.   Weight loss.   Cough.   Fatigue.   Muscle, joint, or bone pain.   Abdominal pain.   Headache.   Lethargy or a change in your level of consciousness.  Difficulty walking or moving your legs.   Numbness in the legs.   Paralysis.  Shortness of breath.   Confusion.   Repeated vomiting.  Document Released: 06/20/2000 Document Revised: 04/13/2013 Document Reviewed: 12/01/2012 [facility] Patient Information 2015 [place]. This information is  not intended to replace advice given to you by your health care provider. Make sure you discuss any questions you have with your health care provider.   Take the prescription meds as directed.  Apply OTC cortisone cream as needed for itch relief.  [facility] your provider or [facility] needed.

## 2015-01-20 ENCOUNTER — Emergency Department
Admission: EM | Admit: 2015-01-20 | Discharge: 2015-01-20 | Disposition: A | Discharge status: Home / Self Care | Payer: Medicare Other | Attending: Emergency Medicine | Admitting: Emergency Medicine

## 2015-01-20 ENCOUNTER — Encounter: Payer: Self-pay | Admitting: Emergency Medicine

## 2015-01-20 DIAGNOSIS — L299 Pruritus, unspecified: Secondary | ICD-10-CM | POA: Diagnosis present

## 2015-01-20 DIAGNOSIS — Z87891 Personal history of nicotine dependence: Secondary | ICD-10-CM | POA: Diagnosis not present

## 2015-01-20 DIAGNOSIS — Z792 Long term (current) use of antibiotics: Secondary | ICD-10-CM | POA: Insufficient documentation

## 2015-01-20 DIAGNOSIS — L259 Unspecified contact dermatitis, unspecified cause: Secondary | ICD-10-CM | POA: Insufficient documentation

## 2015-01-20 MED ORDER — TRIAMCINOLONE ACETONIDE 0.5 % EX OINT: 1.0000 "application " | TOPICAL_OINTMENT | Freq: Two times a day (BID) | CUTANEOUS | Status: DC

## 2015-01-20 NOTE — ED Provider Notes (Signed)
CSN: [id number]     Arrival date & time 01/20/15  1242 History   First MD Initiated Contact with Patient 01/20/15 1359     Chief Complaint  Patient presents with  . Back Pain    states had tick removed from back and has sore spot at removal site    HPI Comments: [age] year old male presents today for itching and burning at site of tick bite earlier this summer. He was seen in this department for the same and prescribed a course of doxycycline. Now his left shoulder is irritated. He has not tried any over the counter creams to the shoulder.   Patient is a 81 y.o. male presenting with rash. The history is provided by the patient.  Rash Location:  Shoulder/arm Shoulder/arm rash location:  L shoulder Quality: burning, itchiness and swelling   Severity:  Mild Onset quality:  Gradual Duration:  7 days Timing:  Constant Progression:  Unchanged Context: insect bite/sting   Relieved by:  None tried Worsened by:  Contact   Past Medical History  Diagnosis Date  . Hypertension    History reviewed. No pertinent past surgical history. No family history on file. History  Substance Use Topics  . Smoking status: Former Games developermoker  . Smokeless tobacco: Not on file  . Alcohol Use: Yes    Review of Systems  Skin: Positive for rash.  All other systems reviewed and are negative.     Allergies  Review of patient's allergies indicates no known allergies.  Home Medications   Prior to Admission medications   Medication Sig Start Date End Date Taking? Authorizing Provider  doxycycline (VIBRA-TABS) 100 MG tablet Take 1 tablet (100 mg total) by mouth 2 (two) times daily. 12/08/14   Jenise V Bacon Menshew, PA-C  triamcinolone ointment (KENALOG) 0.5 % Apply 1 application topically 2 (two) times daily. 01/20/15   Wilber OliphantEmma Weavil V, PA-C   BP 132/9 mmHg  Pulse 75  Temp(Src) 98.6 F (37 C) (Oral)  Resp 18  Ht 5\' 6"  (1.676 m)  Wt 138 lb (62.596 kg)  BMI 22.28 kg/m2  SpO2 98% Physical Exam   Constitutional: He is oriented to person, place, and time. Vital signs are normal. He appears well-developed and well-nourished.  Musculoskeletal: Normal range of motion.  Neurological: He is alert and oriented to person, place, and time.  Skin: Skin is warm and dry. Rash noted.  Erythematous papules to left shoulder, no ticks seen, no target lesion   Psychiatric: He has a normal mood and affect. His behavior is normal. Judgment and thought content normal.  Nursing note and vitals reviewed.   ED Course  Procedures (including critical care time) Labs Review Labs Reviewed - No data to display  Imaging Review No results found.   EKG Interpretation None      MDM  Reviewed notes from prior Er visit on 12/08/14. Pt was prescribed doxy for tick bite. Will write triamcinolone for rash. Return for worsening symptoms  Final diagnoses:  Contact dermatitis        Luvenia Reddenmma Weavil V, PA-C 01/20/15 1431  Jene Everyobert Kinner, MD 01/20/15 [phone]

## 2015-02-08 ENCOUNTER — Emergency Department
Admission: EM | Admit: 2015-02-08 | Discharge: 2015-02-08 | Disposition: A | Discharge status: Home / Self Care | Payer: Medicare Other | Attending: Emergency Medicine | Admitting: Emergency Medicine

## 2015-02-08 ENCOUNTER — Encounter: Payer: Self-pay | Admitting: Emergency Medicine

## 2015-02-08 DIAGNOSIS — Z0000 Encounter for general adult medical examination without abnormal findings: Secondary | ICD-10-CM | POA: Insufficient documentation

## 2015-02-08 DIAGNOSIS — Z87891 Personal history of nicotine dependence: Secondary | ICD-10-CM | POA: Insufficient documentation

## 2015-02-08 DIAGNOSIS — I10 Essential (primary) hypertension: Secondary | ICD-10-CM | POA: Diagnosis not present

## 2015-02-08 DIAGNOSIS — S20469D Insect bite (nonvenomous) of unspecified back wall of thorax, subsequent encounter: Secondary | ICD-10-CM | POA: Insufficient documentation

## 2015-02-08 DIAGNOSIS — W57XXXD Bitten or stung by nonvenomous insect and other nonvenomous arthropods, subsequent encounter: Secondary | ICD-10-CM | POA: Diagnosis not present

## 2015-02-08 DIAGNOSIS — Z792 Long term (current) use of antibiotics: Secondary | ICD-10-CM | POA: Diagnosis not present

## 2015-02-08 DIAGNOSIS — Z791 Long term (current) use of non-steroidal anti-inflammatories (NSAID): Secondary | ICD-10-CM | POA: Insufficient documentation

## 2015-02-08 NOTE — ED Notes (Signed)
Has small red area on back, sore to touch

## 2015-02-08 NOTE — Discharge Instructions (Signed)
° ° °  NO TICK WAS SEEN ON TODAY'S VISIT FOLLOW UP WITH YOUR DOCTOR AT [facility]

## 2015-02-08 NOTE — ED Provider Notes (Signed)
[facility] Emergency Department Provider Note  ____________________________________________  Time seen:  12:21 PM  I have reviewed the triage vital signs and the nursing notes.   HISTORY  Chief Complaint Tick Removal   HPI Richard [name] is a 81 y.o. male is here to have a tick removed from his back. He was here several weeks ago and had one removed. He did take the prescribed course of doxycycline and had no reaction. He states he does not have a appointment with his PCP until next month. He has not had any fever or rash. He is unable to see well and thinks there is a tick on his back at this time.   Past Medical History  Diagnosis Date  . Hypertension     There are no active problems to display for this patient.   History reviewed. No pertinent past surgical history.  Current Outpatient Rx  Name  Route  Sig  Dispense  Refill  . doxycycline (VIBRA-TABS) 100 MG tablet   Oral   Take 1 tablet (100 mg total) by mouth 2 (two) times daily.   14 tablet   0   . triamcinolone ointment (KENALOG) 0.5 %   Topical   Apply 1 application topically 2 (two) times daily.   30 g   0     Allergies Review of patient's allergies indicates no known allergies.  No family history on file.  Social History History  Substance Use Topics  . Smoking status: Former Games developer  . Smokeless tobacco: Not on file  . Alcohol Use: Yes    Review of Systems Constitutional: No fever/chills Eyes: No visual changes. ENT: No sore throat. Cardiovascular: Denies chest pain. Respiratory: Denies shortness of breath. Gastrointestinal: No abdominal pain.  No nausea, no vomiting. Musculoskeletal: Negative for back pain. Skin: "Bump" on back questionable tick Neurological: Negative for headaches, focal weakness or numbness.  10-point ROS otherwise negative.  ____________________________________________   PHYSICAL EXAM:  VITAL SIGNS: ED Triage Vitals  Enc Vitals Group   BP 02/08/15 1023 133/71 mmHg     Pulse Rate 02/08/15 1023 76     Resp 02/08/15 1023 16     Temp 02/08/15 1023 98.2 F (36.8 C)     Temp Source 02/08/15 1023 Oral     SpO2 02/08/15 1023 95 %     Weight 02/08/15 1023 138 lb (62.596 kg)     Height 02/08/15 1023  (1.676 m)     Head Cir --      Peak Flow --      Pain Score 02/08/15 1032 0     Pain Loc --      Pain Edu? --      Excl. in GC? --     Constitutional: Alert and oriented. Well appearing and in no acute distress. Eyes: Conjunctivae are normal. PERRL. EOMI. Head: Atraumatic. Nose: No congestion/rhinnorhea. Neck: No stridor.   Hematological/Lymphatic/Immunilogical: No cervical lymphadenopathy. Cardiovascular: Normal rate, regular rhythm. Grossly normal heart sounds.  Good peripheral circulation. Respiratory: Normal respiratory effort.  No retractions. Lungs CTAB. Gastrointestinal: Soft and nontender. No distention Musculoskeletal: No lower extremity tenderness nor edema.  No joint effusions. Neurologic:  Normal speech and language. No gross focal neurologic deficits are appreciated. No gait instability. Skin:  Skin is warm, dry and intact. No rash noted. Back and chest wall was checked and no tick was observed. There is one small pinpoint papule on the back with no infection. Nontender to touch. Psychiatric: Mood and  affect are normal. Speech and behavior are normal.  ____________________________________________   LABS (all labs ordered are listed, but only abnormal results are displayed)  Labs Reviewed - No data to display  PROCEDURES  Procedure(s) performed: None  Critical Care performed: No  ____________________________________________   INITIAL IMPRESSION / ASSESSMENT AND PLAN / ED COURSE  Pertinent labs & imaging results that were available during my care of the patient were reviewed by me and considered in my medical decision making (see chart for details).  Patient was reassured that this is not a  tick. He will follow-up with his doctor at [facility]. He is encouraged to return to the emergency room if any urgent concerns. ____________________________________________   FINAL CLINICAL IMPRESSION(S) / ED DIAGNOSES  Final diagnoses:  Well adult exam  History of Tick Bite    Tommi Rumps, PA-C 02/08/15 1227  Darien Ramus, MD 02/08/15 [phone]

## 2015-02-08 NOTE — ED Notes (Signed)
Pt here with c/o having a possible tic on his back, had one removed here a few weeks ago, states "the area is sore back there." Upon assessment, no tic noted. Small pinpoint red raised bump noted.

## 2015-02-25 ENCOUNTER — Emergency Department: Payer: Medicare Other

## 2015-02-25 ENCOUNTER — Other Ambulatory Visit: Payer: Self-pay

## 2015-02-25 ENCOUNTER — Emergency Department
Admission: EM | Admit: 2015-02-25 | Discharge: 2015-02-25 | Disposition: A | Discharge status: Home / Self Care | Payer: Medicare Other | Attending: Emergency Medicine | Admitting: Emergency Medicine

## 2015-02-25 ENCOUNTER — Encounter: Payer: Self-pay | Admitting: Emergency Medicine

## 2015-02-25 DIAGNOSIS — Z87891 Personal history of nicotine dependence: Secondary | ICD-10-CM | POA: Diagnosis not present

## 2015-02-25 DIAGNOSIS — R52 Pain, unspecified: Secondary | ICD-10-CM | POA: Diagnosis present

## 2015-02-25 DIAGNOSIS — R918 Other nonspecific abnormal finding of lung field: Secondary | ICD-10-CM | POA: Insufficient documentation

## 2015-02-25 DIAGNOSIS — R0600 Dyspnea, unspecified: Secondary | ICD-10-CM | POA: Diagnosis not present

## 2015-02-25 DIAGNOSIS — I10 Essential (primary) hypertension: Secondary | ICD-10-CM | POA: Insufficient documentation

## 2015-02-25 DIAGNOSIS — R634 Abnormal weight loss: Secondary | ICD-10-CM | POA: Diagnosis not present

## 2015-02-25 LAB — URINALYSIS COMPLETE WITH MICROSCOPIC (ARMC ONLY)
BACTERIA UA: NONE SEEN
Bilirubin Urine: NEGATIVE
Glucose, UA: NEGATIVE mg/dL
Ketones, ur: NEGATIVE mg/dL
Leukocytes, UA: NEGATIVE
NITRITE: NEGATIVE
PROTEIN: NEGATIVE mg/dL
SPECIFIC GRAVITY, URINE: 1.014 (ref 1.005–1.030)
pH: 6 (ref 5.0–8.0)

## 2015-02-25 LAB — CBC WITH DIFFERENTIAL/PLATELET
BASOS ABS: 0 10*3/uL (ref 0–0.1)
BASOS PCT: 1 %
Eosinophils Absolute: 0.1 10*3/uL (ref 0–0.7)
Eosinophils Relative: 2 %
HEMATOCRIT: 33.5 % — AB (ref 40.0–52.0)
HEMOGLOBIN: 10.8 g/dL — AB (ref 13.0–18.0)
Lymphocytes Relative: 15 %
Lymphs Abs: 0.7 10*3/uL — ABNORMAL LOW (ref 1.0–3.6)
MCH: 28.4 pg (ref 26.0–34.0)
MCHC: 32.2 g/dL (ref 32.0–36.0)
MCV: 88.2 fL (ref 80.0–100.0)
Monocytes Absolute: 0.6 10*3/uL (ref 0.2–1.0)
Monocytes Relative: 12 %
NEUTROS ABS: 3.4 10*3/uL (ref 1.4–6.5)
NEUTROS PCT: 70 %
Platelets: 240 10*3/uL (ref 150–440)
RBC: 3.8 MIL/uL — AB (ref 4.40–5.90)
RDW: 14.3 % (ref 11.5–14.5)
WBC: 4.8 10*3/uL (ref 3.8–10.6)

## 2015-02-25 LAB — COMPREHENSIVE METABOLIC PANEL
ALBUMIN: 2.9 g/dL — AB (ref 3.5–5.0)
ALK PHOS: 87 U/L (ref 38–126)
ALT: 12 U/L — AB (ref 17–63)
AST: 17 U/L (ref 15–41)
Anion gap: 8 (ref 5–15)
BILIRUBIN TOTAL: 0.4 mg/dL (ref 0.3–1.2)
BUN: 12 mg/dL (ref 6–20)
CO2: 26 mmol/L (ref 22–32)
Calcium: 9.1 mg/dL (ref 8.9–10.3)
Chloride: 102 mmol/L (ref 101–111)
Creatinine, Ser: 1.04 mg/dL (ref 0.61–1.24)
GFR calc Af Amer: >60 mL/min (ref >60)
GFR calc non Af Amer: >60 mL/min (ref >60)
GLUCOSE: 115 mg/dL — AB (ref 65–99)
POTASSIUM: 3.9 mmol/L (ref 3.5–5.1)
Sodium: 136 mmol/L (ref 135–145)
TOTAL PROTEIN: 7 g/dL (ref 6.5–8.1)

## 2015-02-25 LAB — TROPONIN I: Troponin I: <0.03 ng/mL (ref <0.031)

## 2015-02-25 LAB — BRAIN NATRIURETIC PEPTIDE: B Natriuretic Peptide: 46 pg/mL (ref 0.0–100.0)

## 2015-02-25 NOTE — ED Provider Notes (Signed)
[facility] Emergency Department Provider Note  Time seen: 7:10 AM  I have reviewed the triage vital signs and the nursing notes.   HISTORY  Chief Complaint Generalized Body Aches and Shortness of Breath    HPI Richard [name] is a 81 y.o. male with a past medical history of hypertension presents the emergency department for shortness of breath 2-3 weeks as well as general fatigue and weight loss 3 months. According to the patient he lives alone at home.  For the past 2-3 weeks she has been intermittently short of breath, he denies any shortness of breath at this time. He also states for the past 3 months he has been feeling very fatigued in a noticed a considerable amount of weight loss but cannot quantify this. Denies any chest pain, abdominal pain, diarrhea, nausea or vomiting. Currently an the patient has no medical complaints, and states he feels well.     Past Medical History  Diagnosis Date  . Hypertension     There are no active problems to display for this patient.   History reviewed. No pertinent past surgical history.  Current Outpatient Rx  Name  Route  Sig  Dispense  Refill  . MULTIPLE VITAMIN PO   Oral   Take 1 tablet by mouth daily.         Marland Kitchen doxycycline (VIBRA-TABS) 100 MG tablet   Oral   Take 1 tablet (100 mg total) by mouth 2 (two) times daily.   14 tablet   0   . triamcinolone ointment (KENALOG) 0.5 %   Topical   Apply 1 application topically 2 (two) times daily.   30 g   0     Allergies Review of patient's allergies indicates no known allergies.  History reviewed. No pertinent family history.  Social History Social History  Substance Use Topics  . Smoking status: Former Games developer  . Smokeless tobacco: None  . Alcohol Use: Yes    Review of Systems Constitutional: Negative for fever. Positive for weight loss. Cardiovascular: Negative for chest pain. Respiratory: Positive for intermittent shortness of breath 2-3  weeks. Gastrointestinal: Negative for abdominal pain, vomiting and diarrhea. Genitourinary: Negative for dysuria. Neurological: Negative for headaches, focal weakness or numbness. 10-point ROS otherwise negative.  ____________________________________________   PHYSICAL EXAM:  VITAL SIGNS: ED Triage Vitals  Enc Vitals Group     BP 02/25/15 0639 140/76 mmHg     Pulse Rate 02/25/15 0639 77     Resp 02/25/15 0639 18     Temp 02/25/15 0639 98.2 F (36.8 C)     Temp Source 02/25/15 0639 Oral     SpO2 02/25/15 0639 98 %     Weight 02/25/15 0639 121 lb 4.8 oz (55.021 kg)     Height 02/25/15 0639 5\' 6"  (1.676 m)     Head Cir --      Peak Flow --      Pain Score 02/25/15 0640 0     Pain Loc --      Pain Edu? --      Excl. in GC? --     Constitutional: Alert and oriented. Well appearing and in no distress. Frail/thin appearance. Eyes: Normal exam ENT   Head: Normocephalic and atraumatic.   Mouth/Throat: Mucous membranes are moist. Cardiovascular: Normal rate, regular rhythm. No murmur Respiratory: Normal respiratory effort without tachypnea nor retractions. Breath sounds are clear and equal bilaterally. No wheezes/rales/rhonchi. Gastrointestinal: Soft and nontender. No distention.   Musculoskeletal: Nontender with  normal range of motion in all extremities. No lower extremity tenderness or edema. Neurologic:  Normal speech and language. No gross focal neurologic deficits  Skin:  Skin is warm, dry and intact.  Psychiatric: Mood and affect are normal. Speech and behavior are normal.  ____________________________________________    EKG  EKG reviewed and interpreted by myself shows normal sinus rhythm at 73 bpm, widened QRS, left axis deviation, most consistent with left bundle branch block, no concerning ST elevations noted.  ____________________________________________    RADIOLOGY  Left upper lung mass.  ____________________________________________   INITIAL  IMPRESSION / ASSESSMENT AND PLAN / ED COURSE  Pertinent labs & imaging results that were available during my care of the patient were reviewed by me and considered in my medical decision making (see chart for details).  Patient with complaints of intermittent shortness of breath for the past 2-3 weeks, as well as general fatigue and weight loss for the past 3 months. Currently the patient appears well, denies any shortness of breath at this time. Resting comfortably in bed. We will check labs, chest x-ray, and closely monitor in the emergency department.  Labs are largely within normal limits, currently awaiting urinalysis results. Chest x-ray shows a left upper lobe mass possibly cancerous. This mass appears to have increased in size since the prior x-ray of 05/28/14. I discussed this result with the patient, he was unaware of any mass in his chest. Patient overall appears very well, normal vitals, appears to have a good understanding of his diagnosis. I discussed with the patient the need for him to follow-up with an oncologist this week. The patient understands and will call the number provided tomorrow to arrange an appointment.  Urinalysis within normal limits. We'll discharge patient home with oncology follow-up. The patient is agreeable to plan. ____________________________________________   FINAL CLINICAL IMPRESSION(S) / ED DIAGNOSES  Gen. weakness Dyspnea Lung mass   Minna Antis, MD 02/25/15 1005

## 2015-02-25 NOTE — ED Notes (Signed)
Pt reports generalized body aches, SOB, weight loss, and general malaise over past 3 months.  Pt denies CP, n/v/d, denies current pain.  Pt NAD at this time.

## 2015-02-25 NOTE — Discharge Instructions (Signed)
You have been seen in the emergency department for shortness of breath. As we discussed her workup was significant for a lung mass. Please call the number provided for oncology to arrange a follow-up appointment this week. Return to the emergency department for any trouble breathing, chest pain, or any other symptom personally concerning to yourself.    Shortness of Breath Shortness of breath means you have trouble breathing. It could also mean that you have a medical problem. You should get immediate medical care for shortness of breath. CAUSES   Not enough oxygen in the air such as with high altitudes or a smoke-filled room.  Certain lung diseases, infections, or problems.  Heart disease or conditions, such as angina or heart failure.  Low red blood cells (anemia).  Poor physical fitness, which can cause shortness of breath when you exercise.  Chest or back injuries or stiffness.  Being overweight.  Smoking.  Anxiety, which can make you feel like you are not getting enough air. DIAGNOSIS  Serious medical problems can often be found during your physical exam. Tests may also be done to determine why you are having shortness of breath. Tests may include:  Chest X-rays.  Lung function tests.  Blood tests.  An electrocardiogram (ECG).  An ambulatory electrocardiogram. An ambulatory ECG records your heartbeat patterns over a 24-hour period.  Exercise testing.  A transthoracic echocardiogram (TTE). During echocardiography, sound waves are used to evaluate how blood flows through your heart.  A transesophageal echocardiogram (TEE).  Imaging scans. Your health care provider may not be able to find a cause for your shortness of breath after your exam. In this case, it is important to have a follow-up exam with your health care provider as directed.  TREATMENT  Treatment for shortness of breath depends on the cause of your symptoms and can vary greatly. HOME CARE INSTRUCTIONS     Do not smoke. Smoking is a common cause of shortness of breath. If you smoke, ask for help to quit.  Avoid being around chemicals or things that may bother your breathing, such as paint fumes and dust.  Rest as needed. Slowly resume your usual activities.  If medicines were prescribed, take them as directed for the full length of time directed. This includes oxygen and any inhaled medicines.  Keep all follow-up appointments as directed by your health care provider. SEEK MEDICAL CARE IF:   Your condition does not improve in the time expected.  You have a hard time doing your normal activities even with rest.  You have any new symptoms. SEEK IMMEDIATE MEDICAL CARE IF:   Your shortness of breath gets worse.  You feel light-headed, faint, or develop a cough not controlled with medicines.  You start coughing up blood.  You have pain with breathing.  You have chest pain or pain in your arms, shoulders, or abdomen.  You have a fever.  You are unable to walk up stairs or exercise the way you normally do. MAKE SURE YOU:  Understand these instructions.  Will watch your condition.  Will get help right away if you are not doing well or get worse. Document Released: 03/18/2001 Document Revised: 06/28/2013 Document Reviewed: 09/08/2011 [facility] Patient Information 2015 [place], [place]. This information is not intended to replace advice given to you by your health care provider. Make sure you discuss any questions you have with your health care provider.

## 2015-02-25 NOTE — ED Notes (Signed)
Pt resting comfortably. Informed of need for urine sample. Urinal at bedside.

## 2015-03-08 ENCOUNTER — Inpatient Hospital Stay: Payer: Medicare Other | Attending: Internal Medicine | Admitting: Internal Medicine

## 2015-03-08 ENCOUNTER — Encounter: Payer: Self-pay | Admitting: Internal Medicine

## 2015-03-08 VITALS — BP 119/63 | HR 71 | Temp 96.4°F | Resp 18 | Ht 62.0 in | Wt 122.4 lb

## 2015-03-08 DIAGNOSIS — J449 Chronic obstructive pulmonary disease, unspecified: Secondary | ICD-10-CM | POA: Diagnosis not present

## 2015-03-08 DIAGNOSIS — I2510 Atherosclerotic heart disease of native coronary artery without angina pectoris: Secondary | ICD-10-CM

## 2015-03-08 DIAGNOSIS — K769 Liver disease, unspecified: Secondary | ICD-10-CM | POA: Diagnosis not present

## 2015-03-08 DIAGNOSIS — J90 Pleural effusion, not elsewhere classified: Secondary | ICD-10-CM

## 2015-03-08 DIAGNOSIS — Z79899 Other long term (current) drug therapy: Secondary | ICD-10-CM

## 2015-03-08 DIAGNOSIS — I10 Essential (primary) hypertension: Secondary | ICD-10-CM | POA: Diagnosis not present

## 2015-03-08 DIAGNOSIS — Z87891 Personal history of nicotine dependence: Secondary | ICD-10-CM

## 2015-03-08 DIAGNOSIS — R05 Cough: Secondary | ICD-10-CM

## 2015-03-08 MED ORDER — METHYLPREDNISOLONE 4 MG PO TBPK: ORAL_TABLET | ORAL | Status: AC

## 2015-03-08 MED ORDER — ALBUTEROL SULFATE HFA 108 (90 BASE) MCG/ACT IN AERS: 2.0000 | INHALATION_SPRAY | Freq: Four times a day (QID) | RESPIRATORY_TRACT | Status: AC | PRN

## 2015-03-08 NOTE — Progress Notes (Signed)
Pt gets meals on wheels and he eats them.  He around has been loosing wt. According to pt last month he weighed 135 lbs. He gets sob on exertion of walking.

## 2015-03-09 NOTE — Progress Notes (Signed)
[facility]  [phone] [phone]) [phone]     ID: Richard [name] OB: Nov 14, 1920  MR#: [id number]  MVH#:[id number]  Patient Care Team: No Pcp Per Patient as PCP - General (General Practice)  CHIEF COMPLAINT/DIAGNOSIS:  Left upper lobe mass measuring 2.8 cm, loculated left pleural effusion, pleural soft tissue implants, right retroperitoneal soft tissue abnormality on recent CT scan of the chest with contrast on 02/26/2015 done at [facility]  -  patient referred here for oncology evaluation.   HISTORY OF PRESENT ILLNESS:  Richard [name] is a 81 year old gentleman with past medical history significant for hypertension, knee surgery in the past, chronic smoking. More recently, patient states that he has gotten weaker, significant dyspnea on exertion with some wheezing and cough. Recent chest x-ray was abnormal and suggestive of left lung mass and left pleural effusion. He then went to [facility] and had a CT scan of the chest on 02/26/2015 ( FINDINGS: left upper lobe mass measures 2.8 cm in longest dimension. Loculated left pleural effusion. Pleural soft tissue implants are present (image 32, 38, 50). 2L node measures 2.5 cm. Bilateral calcified pleural plaques. Coronary artery calcifications. Low-attenuation liver lesions may be cysts. Right retroperitoneal soft tissue on image 60 may be a site of metastasis. No definite osseous metastasis. Old right clavicle fracture). Patient has been referred here for possible lung cancer. Currently denies any chest pain or other pains. No hemoptysis. Appetite is borderline, denies unintentional weight loss. States that he is significantly weak, resting or sitting most of the time. He is accompanied by his 2 sons also states that his memory has declined over the last few years, and that his overall general condition has also declined significantly. No fever or chills. No obvious bleeding symptoms. Denies headaches, seizures, imbalance or loss  of consciousness.  REVIEW OF SYSTEMS:   ROS CONSTITUTIONAL: As in HPI above. No chills, fever or sweats.    ENT:  No headaches or epistaxis. No ear or jaw pain. No sinus symptoms. RESPIRATORY: As in history of present illness CARDIAC:  No palpitations.  No retrosternal chest pain. No orthopnea, PND. GI:  No abdominal pain, nausea or vomiting. No diarrhea.   GU:  No dysuria or hematuria.  SKIN: No rashes or pruritus. HEMATOLOGIC: denies bleeding symptoms MUSCULOSKELETAL:  No new bone pains.  EXTREMITY:  No new swelling or pain.  NEURO:  No focal weakness. No numbness or tingling of extremities.  No seizures.   ENDOCRINE:  No polyuria or polydipsia.   PS ECOG 3.  PAST MEDICAL HISTORY: Reviewed. Past Medical History  Diagnosis Date  . Hypertension     PAST SURGICAL HISTORY: Reviewed. Past Surgical History  Procedure Laterality Date  . Total knee arthroplasty Left     had a fracture leg and got tkr with rod placement    FAMILY HISTORY: Reviewed. Family History  Problem Relation Age of Onset  . Brain cancer Mother   . Colon cancer Son     SOCIAL HISTORY: Reviewed. Social History  Substance Use Topics  . Smoking status: Former Smoker    Quit date: 02/15/2015  . Smokeless tobacco: Never Used  . Alcohol Use: Yes     Comment: 2 beers last drank 2 weeks ago from today visit    No Known Allergies  Current Outpatient Prescriptions  Medication Sig Dispense Refill  . albuterol (PROVENTIL HFA;VENTOLIN HFA) 108 (90 BASE) MCG/ACT inhaler Inhale 2 puffs into the lungs every 6 (six) hours as needed  for wheezing or shortness of breath. 1 Inhaler 2  . methylPREDNISolone (MEDROL DOSEPAK) 4 MG TBPK tablet Take 6 tablets today, then decrease dose by 1 tablet daily and stop. 21 tablet 0   No current facility-administered medications for this visit.    PHYSICAL EXAM: Filed Vitals:   03/08/15 1023  BP: 119/63  Pulse: 71  Temp: 96.4 F (35.8 C)  Resp: 18     Body mass index is  22.37 kg/(m^2).    ECOG FS:3 - Symptomatic, >50% confined to bed  GENERAL: Patient is elderly, weak and frail looking, otherwise alert and oriented and in no acute distress. There is no icterus. HEENT: EOMs intact. Oral exam negative for thrush. No cervical lymphadenopathy. CVS: S1S2, regular LUNGS: Bilaterally diminished breath sounds, more so in the left lower lobe area. Few rhonchi. No crepitations.  ABDOMEN: Soft, nontender. No hepatomegaly clinically.  NEURO: grossly nonfocal, cranial nerves are intact. Gait slow but unremarkable. EXTREMITIES: No pedal edema. LYMPHATICS: No palpable adenopathy in axillary or inguinal areas. SKIN: No rash or major bruising   LAB RESULTS:    Component Value Date/Time   NA 136 02/25/2015 0652   NA 140 07/23/2014 2041   K 3.9 02/25/2015 0652   K 4.4 07/23/2014 2041   CL 102 02/25/2015 0652   CL 106 07/23/2014 2041   CO2 26 02/25/2015 0652   CO2 28 07/23/2014 2041   GLUCOSE 115* 02/25/2015 0652   GLUCOSE 103* 07/23/2014 2041   BUN 12 02/25/2015 0652   BUN 14 07/23/2014 2041   CREATININE 1.04 02/25/2015 0652   CREATININE 1.23 07/23/2014 2041   CALCIUM 9.1 02/25/2015 0652   CALCIUM 8.1* 07/23/2014 2041   PROT 7.0 02/25/2015 0652   PROT 6.9 07/23/2014 2041   ALBUMIN 2.9* 02/25/2015 0652   ALBUMIN 3.2* 07/23/2014 2041   AST 17 02/25/2015 0652   AST 23 07/23/2014 2041   ALT 12* 02/25/2015 0652   ALT 18 07/23/2014 2041   ALKPHOS 87 02/25/2015 0652   ALKPHOS 88 07/23/2014 2041   BILITOT 0.4 02/25/2015 0652   BILITOT 0.3 07/23/2014 2041   GFRNONAA >60 02/25/2015 0652   GFRNONAA 59* 07/23/2014 2041   GFRNONAA 58* 06/04/2012 2158   GFRAA >60 02/25/2015 0652   GFRAA >60 07/23/2014 2041   GFRAA >60 06/04/2012 2158    Lab Results  Component Value Date   WBC 4.8 02/25/2015   NEUTROABS 3.4 02/25/2015   HGB 10.8* 02/25/2015   HCT 33.5* 02/25/2015   MCV 88.2 02/25/2015   PLT 240 02/25/2015    STUDIES: Dg Chest Port 1 View  02/25/2015    CLINICAL DATA:  Increased shortness of breath and weight loss over past 3 months.  EXAM: PORTABLE CHEST - 1 VIEW  COMPARISON:  05/28/2014  FINDINGS: Increased size of ill-defined masslike opacity is seen in the lateral left upper lobe measuring approximately 3.7 cm. This is highly suspicious for bronchogenic carcinoma. Small left pleural effusion and left basilar atelectasis also noted.  Right lung remains clear. Pulmonary hyperinflation is consistent with COPD. Heart size remains within normal limits.  IMPRESSION: Increased size of masslike opacity in the lateral left upper lobe, and new small left pleural effusion and left basilar atelectasis. This is suspicious for bronchogenic carcinoma. Chest CT with contrast recommended for further evaluation.   Electronically Signed   By: Myles Rosenthal M.D.   On: 02/25/2015 07:14    02/26/2015 - CT scan of the chest with contrast (at [facility]). FINDINGS: left upper  lobe mass measures 2.8 cm in longest dimension. Loculated left pleural effusion. Pleural soft tissue implants are present (image 32, 38, 50). 2L node measures 2.5 cm. Bilateral calcified pleural plaques. Coronary artery calcifications. Low-attenuation liver lesions may be cysts. Right retroperitoneal soft tissue on image 60 may be a site of metastasis. No definite osseous metastasis. Old right clavicle fracture. Impression:  1. Left lung cancer, at least clinical stage and one A given pleural metastasis.  2. Evidence of asbestos exposure.    ASSESSMENT / PLAN:   Left upper lobe mass measuring 2.8 cm, loculated left pleural effusion, pleural soft tissue implants, right retroperitoneal soft tissue abnormality on recent CT scan of the chest with contrast on 02/26/2015 done at [facility]  -  patient referred here for oncology evaluation. Have reviewed records sent by referring physician and CT scan, and discussed in detail with patient and sons present. Have explained to them that given history of  smoking and above radiological findings, there is a high likelihood that he has advanced lung cancer, and given multiple sites of abnormalities and possible metastatic disease in the pleura and also possibly retroperitoneal area, that it would be incurable stage malignancy (stage IV since it is at least M1a from pleural nodules) and that any treatment pursued would be with palliative intent only, and that at his advanced age it would be difficult for him to tolerate cancer treatment. Patient and his sons also definitively state that they do not want to pursue further workup to get tissue diagnosis since they do not want to put him through cancer treatment either. Will try Medrol Dosepak and Albuterol inhaler for dyspnea on exertion coming from possible COPD, also explained that some of his symptoms could be from lung cancer. Otherwise patient does not have any pain issues or hemoptysis at this time. Have recommended that since we are not pursuing workup or treatment, that we initiate home hospice since overall prognosis is poor and given his frail condition and advanced age. They are agreeable to this, will make hospice referral. We will see him back on an as needed basis. They are agreeable to this plan.   Janese Banks, MD   03/09/2015 4:14 PM

## 2015-03-13 ENCOUNTER — Telehealth: Payer: Self-pay | Admitting: *Deleted

## 2015-03-13 NOTE — Telephone Encounter (Signed)
Dr. Sherrlyn Hock finally rcvd info from [facility] including ct scan report and based on that and pandit clinical knowledge states that pt likely has advanced stage lung cancer, left pleural mass and pleural deposits and left pleural effusion. Rec; hospice in  The office but wanted to see final report to determine if pandit felt like it was cancer.  Called 1 son and got voicemail, called the other son and was given cell phone for Damek the son I just called and left mess.  Called Tyrel after I got cell phone [phone] then I called sister Cordelia Pen and spoke to her and the brothers want the pt in asst living and the daughter does not want him there.  All children are in agreement to have hospice and the hospice papers faxed in late Friday evening.  I have called hospice today 9/6 and they are going out to eval. Pt tom am.  Family is aware of that.

## 2015-04-07 DEATH — deceased

## 2015-11-28 IMAGING — CR DG CHEST 1V PORT
1 series · 2 of 2 positions shown · non-contrast
Comparison: 05/28/2014

CLINICAL DATA: Increased shortness of breath and weight loss over
past 3 months.

EXAM:
PORTABLE CHEST - 1 VIEW

[Series 1: ap · 0.17mm/px · 2 of 2 slices shown]
[im 1/2]
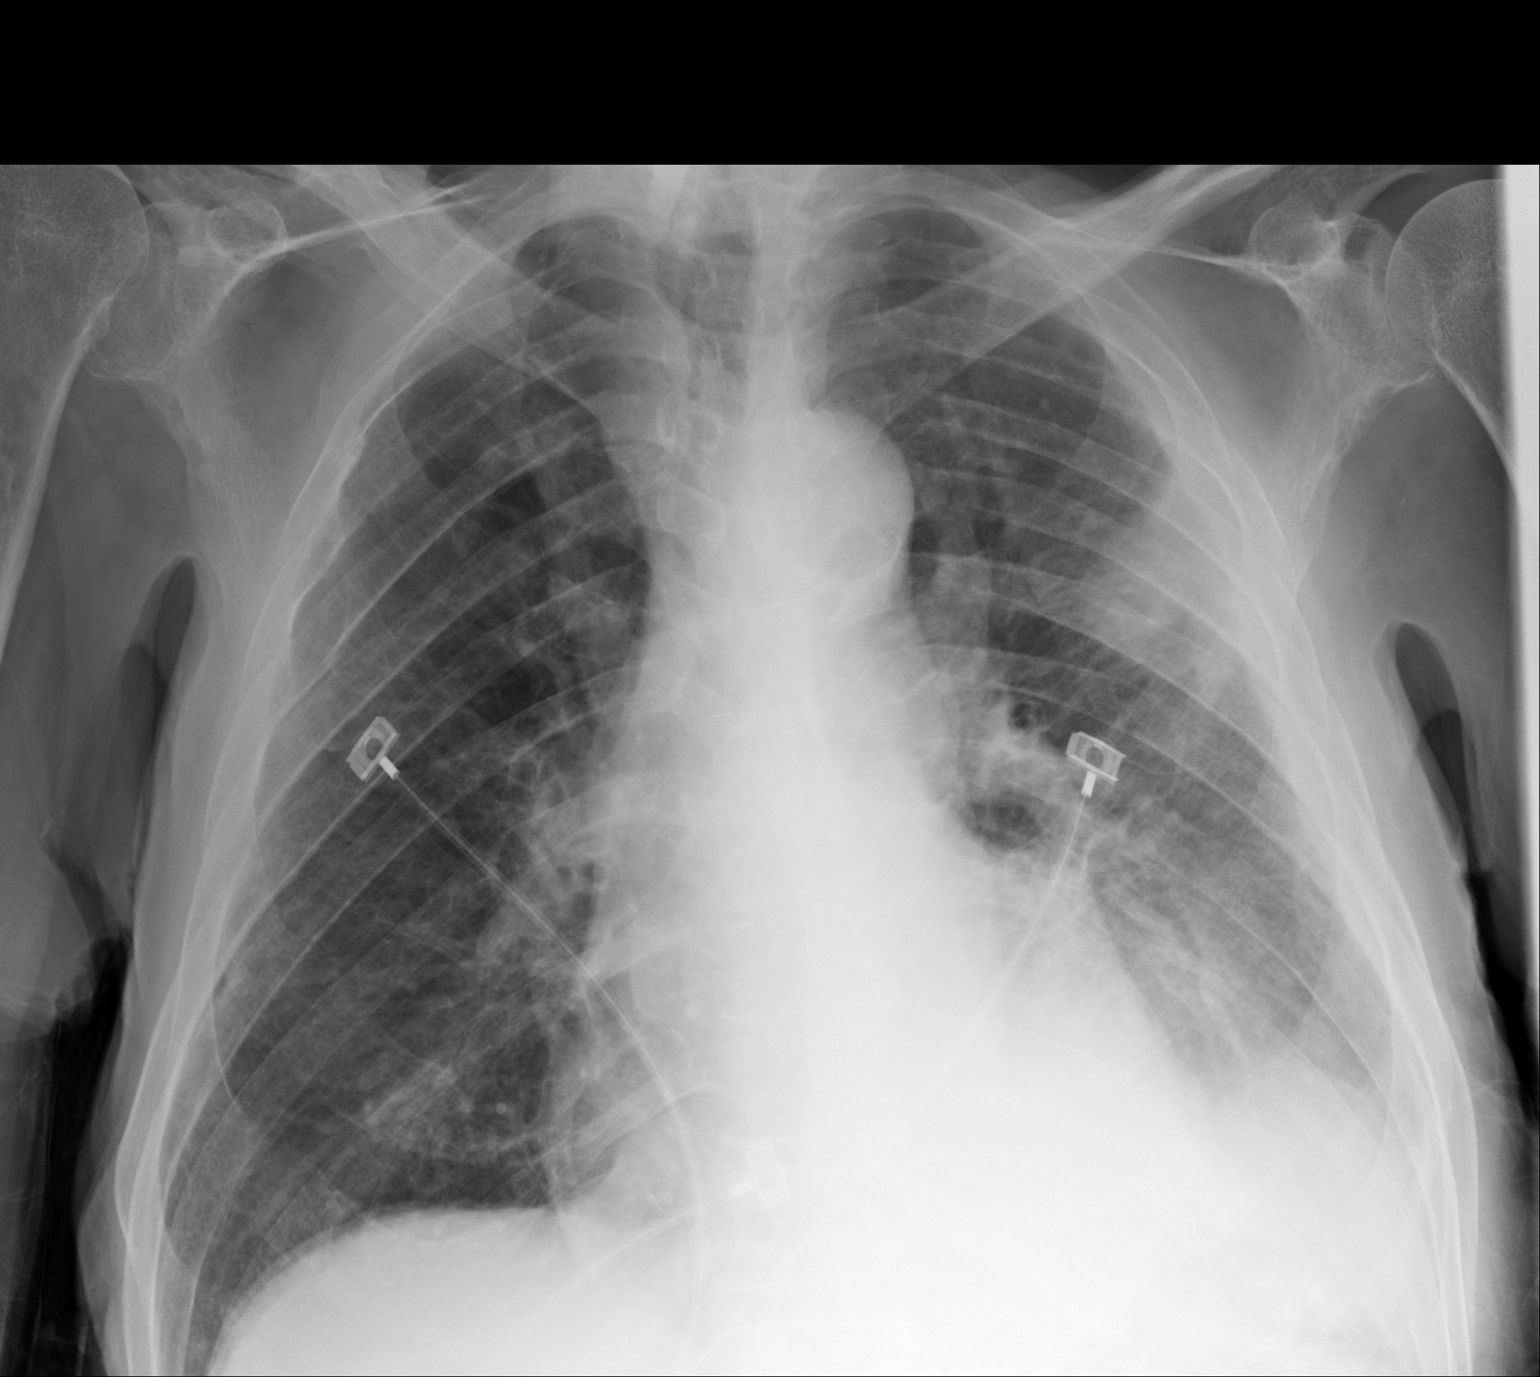
[im 2/2]
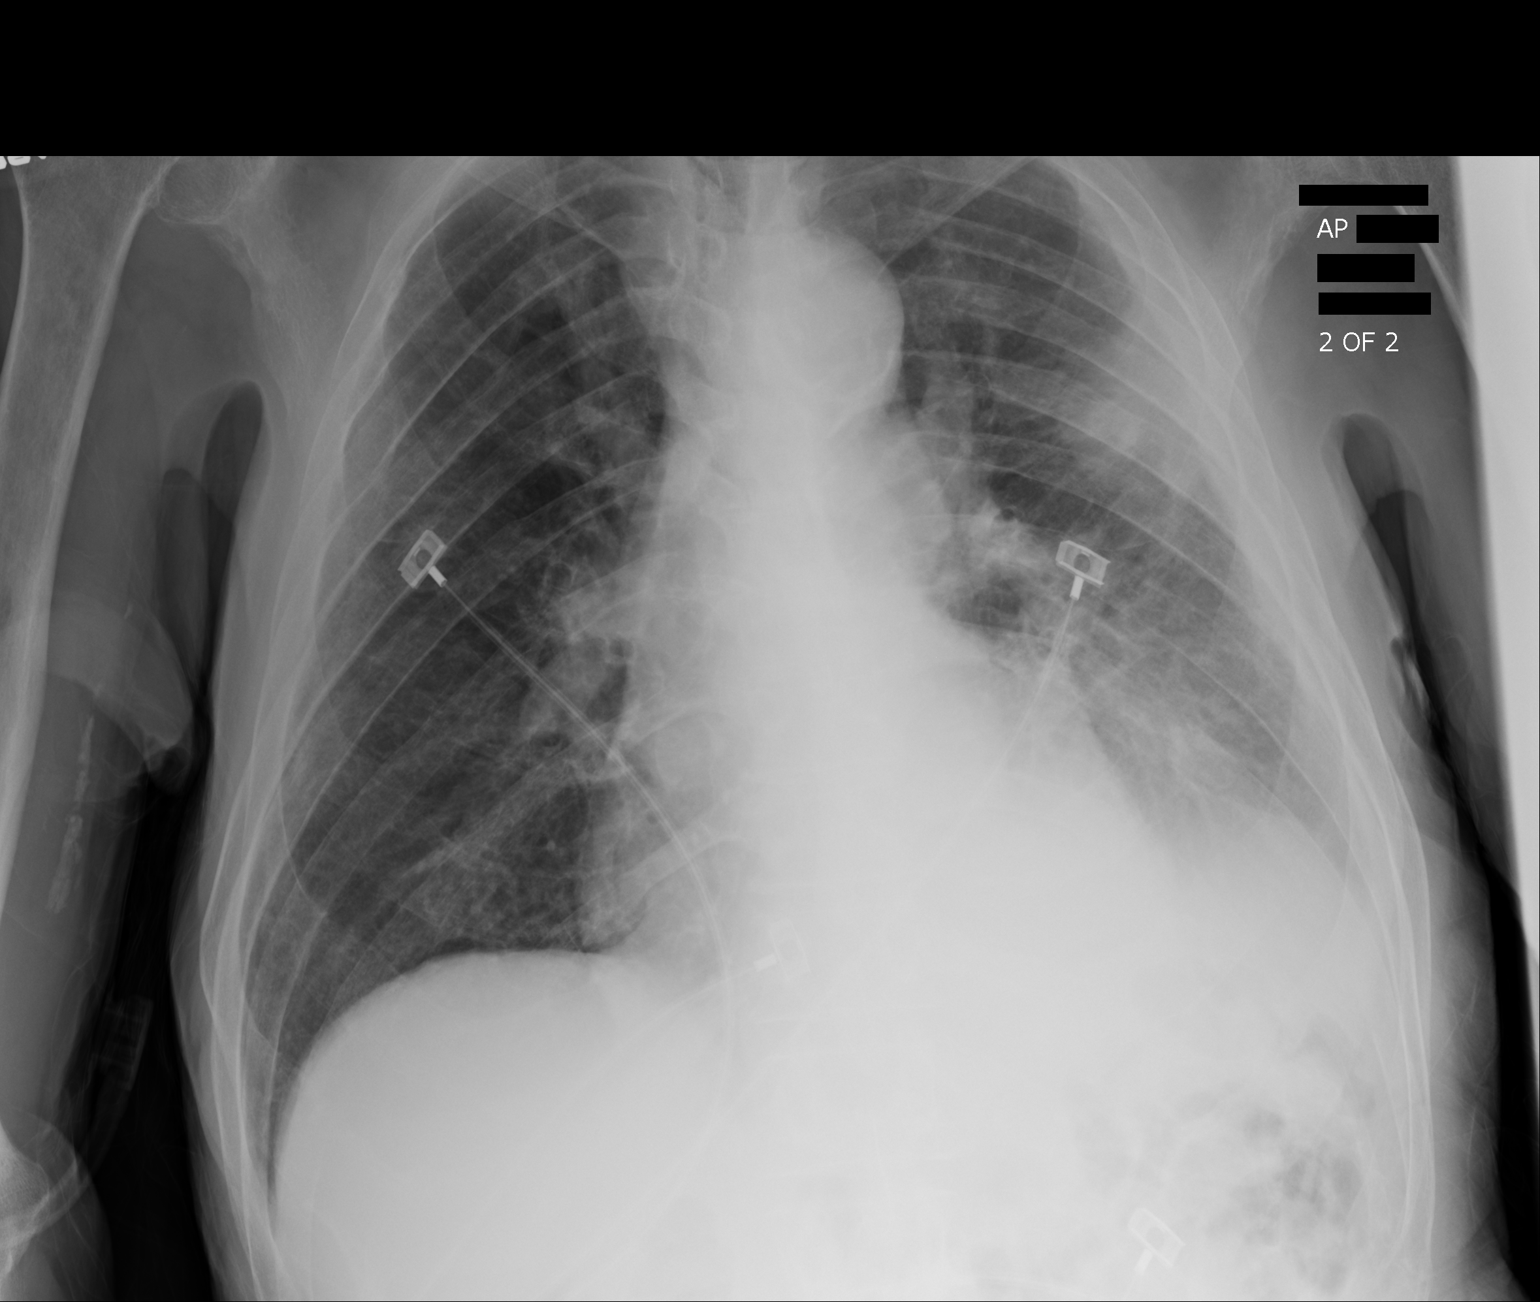

[2 of 2 positions shown; findings below may reference images not displayed]

FINDINGS: Increased size of ill-defined masslike opacity is seen in the
lateral left upper lobe measuring approximately 3.7 cm. This is
highly suspicious for bronchogenic carcinoma. Small left pleural
effusion and left basilar atelectasis also noted.

Right lung remains clear. Pulmonary hyperinflation is consistent
with COPD. Heart size remains within normal limits.
IMPRESSION: Increased size of masslike opacity in the lateral left upper lobe,
and new small left pleural effusion and left basilar atelectasis.
This is suspicious for bronchogenic carcinoma. Chest CT with
contrast recommended for further evaluation.
